# Patient Record
Sex: Female | Born: 1980 | Race: Black or African American | Hispanic: No | Marital: Married | Smoking: Never smoker
Health system: Southern US, Community
[De-identification: ages and names within clinical notes are randomized; demographics above are authoritative.]

## PROBLEM LIST (undated history)

## (undated) ENCOUNTER — Emergency Department (HOSPITAL_COMMUNITY): Payer: PRIVATE HEALTH INSURANCE

## (undated) DIAGNOSIS — Z789 Other specified health status: Secondary | ICD-10-CM

## (undated) DIAGNOSIS — D649 Anemia, unspecified: Secondary | ICD-10-CM

## (undated) HISTORY — DX: Other specified health status: Z78.9

## (undated) HISTORY — PX: NO PAST SURGERIES: SHX2092

## (undated) HISTORY — PX: OTHER SURGICAL HISTORY: SHX169

---

## 2007-05-29 ENCOUNTER — Inpatient Hospital Stay (HOSPITAL_COMMUNITY): Admission: AD | Admit: 2007-05-29 | Discharge: 2007-05-29 | Payer: Self-pay | Admitting: Obstetrics and Gynecology

## 2010-11-29 LAB — WET PREP, GENITAL
Trich, Wet Prep: NONE SEEN
Yeast Wet Prep HPF POC: NONE SEEN

## 2010-11-29 LAB — GC/CHLAMYDIA PROBE AMP, GENITAL: Chlamydia, DNA Probe: NEGATIVE

## 2015-08-15 DIAGNOSIS — Z3041 Encounter for surveillance of contraceptive pills: Secondary | ICD-10-CM | POA: Diagnosis not present

## 2015-08-15 DIAGNOSIS — Z113 Encounter for screening for infections with a predominantly sexual mode of transmission: Secondary | ICD-10-CM | POA: Diagnosis not present

## 2018-09-14 ENCOUNTER — Inpatient Hospital Stay (HOSPITAL_COMMUNITY)
Admission: AD | Admit: 2018-09-14 | Discharge: 2018-09-14 | Disposition: A | Discharge status: Home / Self Care | Payer: BC Managed Care – PPO | Attending: Obstetrics and Gynecology | Admitting: Obstetrics and Gynecology

## 2018-09-14 ENCOUNTER — Other Ambulatory Visit: Payer: Self-pay

## 2018-09-14 ENCOUNTER — Inpatient Hospital Stay (HOSPITAL_COMMUNITY): Payer: BC Managed Care – PPO

## 2018-09-14 ENCOUNTER — Encounter (HOSPITAL_COMMUNITY): Payer: Self-pay | Admitting: Student

## 2018-09-14 DIAGNOSIS — O3680X0 Pregnancy with inconclusive fetal viability, not applicable or unspecified: Secondary | ICD-10-CM

## 2018-09-14 DIAGNOSIS — Z3A01 Less than 8 weeks gestation of pregnancy: Secondary | ICD-10-CM | POA: Diagnosis not present

## 2018-09-14 DIAGNOSIS — O209 Hemorrhage in early pregnancy, unspecified: Secondary | ICD-10-CM | POA: Diagnosis present

## 2018-09-14 LAB — CBC
HCT: 35.8 % — ABNORMAL LOW (ref 36.0–46.0)
Hemoglobin: 12.2 g/dL (ref 12.0–15.0)
MCH: 31.6 pg (ref 26.0–34.0)
MCHC: 34.1 g/dL (ref 30.0–36.0)
MCV: 92.7 fL (ref 80.0–100.0)
Platelets: 284 10*3/uL (ref 150–400)
RBC: 3.86 MIL/uL — ABNORMAL LOW (ref 3.87–5.11)
RDW: 13.6 % (ref 11.5–15.5)
WBC: 8.5 10*3/uL (ref 4.0–10.5)
nRBC: 0 % (ref 0.0–0.2)

## 2018-09-14 LAB — URINALYSIS, ROUTINE W REFLEX MICROSCOPIC
Bacteria, UA: NONE SEEN
Bilirubin Urine: NEGATIVE
Glucose, UA: NEGATIVE mg/dL
Ketones, ur: 5 mg/dL — AB
Leukocytes,Ua: NEGATIVE
Nitrite: NEGATIVE
Protein, ur: NEGATIVE mg/dL
Specific Gravity, Urine: 1.021 (ref 1.005–1.030)
pH: 6 (ref 5.0–8.0)

## 2018-09-14 LAB — ABO/RH: ABO/RH(D): O POS

## 2018-09-14 LAB — HCG, QUANTITATIVE, PREGNANCY: hCG, Beta Chain, Quant, S: 480 m[IU]/mL — ABNORMAL HIGH (ref <5)

## 2018-09-14 NOTE — MAU Provider Note (Signed)
Chief Complaint: Possible Pregnancy and Vaginal Bleeding   First Provider Initiated Contact with Patient 09/14/18 1514     SUBJECTIVE HPI: Jeanette Kennedy is a 38 y.o. G2P0010 at Unknown who presents to Maternity Admissions reporting vaginal spotting. Symptoms started this morning. Noted brown spotting on toilet paper x 2 occurrences. Bleeding has not continued. Has had intermittent abdominal cramps this week. Had HCG done in the office ([place]) yesterday, reports it was 242. She is scheduled to return to the office on Monday for a repeat HCG. Patient is worried due to having a miscarriage last month. No recent intercourse or exams.  Currently denies pain.   History reviewed. No pertinent past medical history. OB History  Gravida Para Term Preterm AB Living  2       1    SAB TAB Ectopic Multiple Live Births  1            # Outcome Date GA Lbr Len/2nd Weight Sex Delivery Anes PTL Lv  2 Current           1 SAB 08/22/18           Past Surgical History:  Procedure Laterality Date  . NO PAST SURGERIES     Social History   Socioeconomic History  . Marital status: Single    Spouse name: Not on file  . Number of children: Not on file  . Years of education: Not on file  . Highest education level: Not on file  Occupational History  . Not on file  Social Needs  . Financial resource strain: Not on file  . Food insecurity    Worry: Not on file    Inability: Not on file  . Transportation needs    Medical: Not on file    Non-medical: Not on file  Tobacco Use  . Smoking status: Never Smoker  . Smokeless tobacco: Never Used  Substance and Sexual Activity  . Alcohol use: Not Currently    Comment: socailly  . Drug use: Never  . Sexual activity: Yes  Lifestyle  . Physical activity    Days per week: Not on file    Minutes per session: Not on file  . Stress: Not on file  Relationships  . Social Herbalist on phone: Not on file    Gets together: Not on file    Attends  religious service: Not on file    Active member of club or organization: Not on file    Attends meetings of clubs or organizations: Not on file    Relationship status: Not on file  . Intimate partner violence    Fear of current or ex partner: Not on file    Emotionally abused: Not on file    Physically abused: Not on file    Forced sexual activity: Not on file  Other Topics Concern  . Not on file  Social History Narrative  . Not on file   Family History  Problem Relation Age of Onset  . Cancer Mother   . Healthy Father    No current facility-administered medications on file prior to encounter.    Current Outpatient Medications on File Prior to Encounter  Medication Sig Dispense Refill  . prenatal vitamin w/FE, FA (PRENATAL 1 + 1) 27-1 MG TABS tablet Take 2 tablets by mouth daily at 12 noon. 2 gummies     No Known Allergies  I have reviewed patient's Past Medical Hx, Surgical Hx, Family Hx, Social Hx,  medications and allergies.   Review of Systems  Constitutional: Negative.   Gastrointestinal: Positive for abdominal pain and nausea. Negative for constipation, diarrhea and vomiting.  Genitourinary: Positive for vaginal bleeding. Negative for dysuria and vaginal discharge.    OBJECTIVE Patient Vitals for the past 24 hrs:  BP Temp Temp src Pulse Resp SpO2 Height Weight  09/14/18 1338 128/69 98.4 F (36.9 C) Oral 93 18 100 % 5\' 3"  (1.6 m) 60.6 kg   Constitutional: Well-developed, well-nourished female in no acute distress.  Cardiovascular: normal rate & rhythm, no murmur Respiratory: normal rate and effort. Lung sounds clear throughout GI: Abd soft, non-tender, Pos BS x 4. No guarding or rebound tenderness MS: Extremities nontender, no edema, normal ROM Neurologic: Alert and oriented x 4.  GU:     SPECULUM EXAM: NEFG, minimal amount of old brown blood. No active bleeding. Cervix pink/no friability    LAB RESULTS Results for orders placed or performed during the hospital  encounter of 09/14/18 (from the past 24 hour(s))  CBC     Status: Abnormal   Collection Time: 09/14/18  2:08 PM  Result Value Ref Range   WBC 8.5 4.0 - 10.5 K/uL   RBC 3.86 (L) 3.87 - 5.11 MIL/uL   Hemoglobin 12.2 12.0 - 15.0 g/dL   HCT 35.8 (L) 36.0 - 46.0 %   MCV 92.7 80.0 - 100.0 fL   MCH 31.6 26.0 - 34.0 pg   MCHC 34.1 30.0 - 36.0 g/dL   RDW 13.6 11.5 - 15.5 %   Platelets 284 150 - 400 K/uL   nRBC 0.0 0.0 - 0.2 %  ABO/Rh     Status: None   Collection Time: 09/14/18  2:08 PM  Result Value Ref Range   ABO/RH(D) O POS    No rh immune globuloin      NOT A RH IMMUNE GLOBULIN CANDIDATE, PT RH POSITIVE Performed at [facility] [address] [place], [place] [address]   hCG, quantitative, pregnancy     Status: Abnormal   Collection Time: 09/14/18  2:08 PM  Result Value Ref Range   hCG, Beta Chain, Quant, S 480 (H) <5 mIU/mL    IMAGING US Ob Less Than 14 Weeks With Ob Transvaginal  Result Date: 09/14/2018 CLINICAL DATA:  Vaginal bleeding, history of spontaneous abortion 08/22/2018 EXAM: OBSTETRIC <14 WK [place] AND TRANSVAGINAL OB US TECHNIQUE: Transvaginal ultrasound was performed for complete evaluation of the gestation as well as the maternal uterus, adnexal regions, and pelvic cul-de-sac. COMPARISON:  None. FINDINGS: Intrauterine gestational sac: None Yolk sac:  Not Visualized. Embryo:  Not Visualized. Cardiac Activity: Not Visualized. Heart Rate: Not applicable Subchorionic hemorrhage:  None visualized. Maternal uterus/adnexae: Uterus: Hypoechoic fundal fibroid measuring 2.4 x 2.2.4 cm. Endometrium: Nonvisualization of a gestational sac. Double-stripe endometrial thickness measures 12 mm. No vascular debris or mass within the endometrial canal. Small amount of fluid within the endometrial canal. Right ovary: Corpus luteum in the right ovary with "ring of fire vascularity". Left ovary: Unremarkable.  Normal color IMPRESSION: Nonvisualization of the gestational sac compatible with  history of spontaneous abortion reportedly 08/22/2018. Trace endometrial thickening and fluid in the endometrial canal without demonstrable vascularity. Findings are nonspecific although felt to most likely reflect endometrial blood products although hypovascular retained products of conception not fully excluded. Consider short-term follow-up pelvic ultrasound if symptoms persist or further evaluation with pelvic MRI with and without IV contrast as clinically warranted. These results will be called to the ordering clinician or  representative by the Radiologist Assistant, and communication documented in the PACS or zVision Dashboard. Electronically Signed   By: MD Lovena Le   On: 09/14/2018 16:44    MAU COURSE Orders Placed This Encounter  Procedures  . US OB LESS THAN 14 WEEKS WITH OB TRANSVAGINAL  . CBC  . hCG, quantitative, pregnancy  . Urinalysis, Routine w reflex microscopic  . ABO/Rh   No orders of the defined types were placed in this encounter.   MDM +UPT UA, wet prep, GC/chlamydia, CBC, ABO/Rh, quant hCG, and [place] today to rule out ectopic pregnancy  RH positive  HCG 480 Ultrasound shows no IUP Likely appropriate rise from yesterday's labs although I don't have access to those records. Still can't exclude possibility of ectopic pregnancy. Patient to keep her appointment on Monday for repeat HCG.   ASSESSMENT 1. Pregnancy of unknown anatomic location   2. Vaginal bleeding in pregnancy, first trimester     PLAN Discharge home in stable condition. SAB vs ectopic precautions  Allergies as of 09/14/2018   No Known Allergies     Medication List    TAKE these medications   prenatal vitamin w/FE, FA 27-1 MG Tabs tablet Take 2 tablets by mouth daily at 12 noon. 2 gummies        Jorje Guild, NP 09/14/2018  3:14 PM

## 2018-09-14 NOTE — MAU Note (Signed)
Had an SAB on 6/17(last hcg level- 21/9/0), ovulated 6/26.  Took a pregnancy test 7/6 it was positive (took several, the lines are progressing).  Took it because boobs have been really sore, and has nausea.  Today was about to go to lunch, went to the bathroom, saw brown on the tissue when she wiped.  HCG level yesterday was 242.

## 2018-09-14 NOTE — Discharge Instructions (Signed)
Vaginal Bleeding During Pregnancy, First Trimester  A small amount of bleeding from the vagina (spotting) is relatively common during early pregnancy. It usually stops on its own. Various things may cause bleeding or spotting during early pregnancy. Some bleeding may be related to the pregnancy, and some may not. In many cases, the bleeding is normal and is not a problem. However, bleeding can also be a sign of something serious. Be sure to tell your health care provider about any vaginal bleeding right away. Some possible causes of vaginal bleeding during the first trimester include:  Infection or inflammation of the cervix.  Growths (polyps) on the cervix.  Miscarriage or threatened miscarriage.  Pregnancy tissue developing outside of the uterus (ectopic pregnancy).  A mass of tissue developing in the uterus due to an egg being fertilized incorrectly (molar pregnancy). Follow these instructions at home: Activity  Follow instructions from your health care provider about limiting your activity. Ask what activities are safe for you.  If needed, make plans for someone to help with your regular activities.  Do not have sex or orgasms until your health care provider says that this is safe. General instructions  Take over-the-counter and prescription medicines only as told by your health care provider.  Pay attention to any changes in your symptoms.  Do not use tampons or douche.  Write down how many pads you use each day, how often you change pads, and how soaked (saturated) they are.  If you pass any tissue from your vagina, save the tissue so you can show it to your health care provider.  Keep all follow-up visits as told by your health care provider. This is important. Contact a health care provider if:  You have vaginal bleeding during any part of your pregnancy.  You have cramps or labor pains.  You have a fever. Get help right away if:  You have severe cramps in your  back or abdomen.  You pass large clots or a large amount of tissue from your vagina.  Your bleeding increases.  You feel light-headed or weak, or you faint.  You have chills.  You are leaking fluid or have a gush of fluid from your vagina. Summary  A small amount of bleeding (spotting) from the vagina is relatively common during early pregnancy.  Various things may cause bleeding or spotting in early pregnancy.  Be sure to tell your health care provider about any vaginal bleeding right away. This information is not intended to replace advice given to you by your health care provider. Make sure you discuss any questions you have with your health care provider. Document Released: 12/01/2004 Document Revised: 06/12/2018 Document Reviewed: 05/26/2016 Elsevier Patient Education  2020 [employer].

## 2018-09-17 ENCOUNTER — Other Ambulatory Visit (HOSPITAL_COMMUNITY): Payer: Self-pay | Admitting: Obstetrics & Gynecology

## 2018-09-20 ENCOUNTER — Encounter (HOSPITAL_COMMUNITY): Payer: Self-pay | Admitting: Obstetrics & Gynecology

## 2018-09-21 ENCOUNTER — Other Ambulatory Visit (HOSPITAL_COMMUNITY): Payer: Self-pay | Admitting: Obstetrics and Gynecology

## 2018-09-21 ENCOUNTER — Other Ambulatory Visit: Payer: Self-pay

## 2018-09-21 ENCOUNTER — Encounter (HOSPITAL_COMMUNITY): Payer: Self-pay

## 2018-09-21 ENCOUNTER — Ambulatory Visit (HOSPITAL_COMMUNITY)
Admission: RE | Admit: 2018-09-21 | Discharge: 2018-09-21 | Disposition: A | Discharge status: Home / Self Care | Payer: BC Managed Care – PPO | Source: Ambulatory Visit | Attending: Obstetrics and Gynecology | Admitting: Obstetrics and Gynecology

## 2018-09-21 ENCOUNTER — Ambulatory Visit (HOSPITAL_COMMUNITY): Payer: BC Managed Care – PPO | Admitting: *Deleted

## 2018-09-21 VITALS — BP 124/83 | HR 111 | Temp 98.6°F

## 2018-09-21 DIAGNOSIS — O008 Other ectopic pregnancy without intrauterine pregnancy: Secondary | ICD-10-CM

## 2018-09-21 DIAGNOSIS — O09521 Supervision of elderly multigravida, first trimester: Secondary | ICD-10-CM | POA: Diagnosis not present

## 2018-09-21 DIAGNOSIS — Z3A01 Less than 8 weeks gestation of pregnancy: Secondary | ICD-10-CM

## 2018-09-21 DIAGNOSIS — O0990 Supervision of high risk pregnancy, unspecified, unspecified trimester: Secondary | ICD-10-CM | POA: Diagnosis present

## 2018-09-21 DIAGNOSIS — Z3687 Encounter for antenatal screening for uncertain dates: Secondary | ICD-10-CM

## 2018-09-21 DIAGNOSIS — O3411 Maternal care for benign tumor of corpus uteri, first trimester: Secondary | ICD-10-CM | POA: Diagnosis not present

## 2018-09-24 ENCOUNTER — Other Ambulatory Visit (HOSPITAL_COMMUNITY): Payer: Self-pay | Admitting: *Deleted

## 2018-09-24 DIAGNOSIS — Z3491 Encounter for supervision of normal pregnancy, unspecified, first trimester: Secondary | ICD-10-CM

## 2018-10-02 ENCOUNTER — Inpatient Hospital Stay (HOSPITAL_COMMUNITY)
Admission: AD | Admit: 2018-10-02 | Discharge: 2018-10-02 | Disposition: A | Discharge status: Home / Self Care | Payer: BC Managed Care – PPO | Attending: Obstetrics and Gynecology | Admitting: Obstetrics and Gynecology

## 2018-10-02 ENCOUNTER — Inpatient Hospital Stay (HOSPITAL_COMMUNITY): Payer: BC Managed Care – PPO

## 2018-10-02 ENCOUNTER — Other Ambulatory Visit: Payer: Self-pay

## 2018-10-02 ENCOUNTER — Encounter (HOSPITAL_COMMUNITY): Payer: Self-pay | Admitting: *Deleted

## 2018-10-02 DIAGNOSIS — O418X10 Other specified disorders of amniotic fluid and membranes, first trimester, not applicable or unspecified: Secondary | ICD-10-CM

## 2018-10-02 DIAGNOSIS — O208 Other hemorrhage in early pregnancy: Secondary | ICD-10-CM | POA: Diagnosis not present

## 2018-10-02 DIAGNOSIS — O26891 Other specified pregnancy related conditions, first trimester: Secondary | ICD-10-CM | POA: Insufficient documentation

## 2018-10-02 DIAGNOSIS — N898 Other specified noninflammatory disorders of vagina: Secondary | ICD-10-CM | POA: Diagnosis present

## 2018-10-02 DIAGNOSIS — O468X1 Other antepartum hemorrhage, first trimester: Secondary | ICD-10-CM | POA: Diagnosis not present

## 2018-10-02 DIAGNOSIS — Z3A01 Less than 8 weeks gestation of pregnancy: Secondary | ICD-10-CM | POA: Insufficient documentation

## 2018-10-02 DIAGNOSIS — N939 Abnormal uterine and vaginal bleeding, unspecified: Secondary | ICD-10-CM

## 2018-10-02 LAB — WET PREP, GENITAL
Clue Cells Wet Prep HPF POC: NONE SEEN
Sperm: NONE SEEN
Trich, Wet Prep: NONE SEEN
Yeast Wet Prep HPF POC: NONE SEEN

## 2018-10-02 NOTE — MAU Provider Note (Signed)
History     CSN: [id number]  Arrival date and time: 10/02/18 1718   First Provider Initiated Contact with Patient 10/02/18 2128      Chief Complaint  Patient presents with  . Vaginal Discharge   Jeanette Kennedy is a 38 y.o. G2P0010 at [redacted]w[redacted]d who receives care at [facility].  She presents today for Vaginal Discharge that started on Saturday.  She denies other vaginal discharge prior to this and states she has had brown discharge only with wiping. She states she was evaluated and cleared, on Friday, for a suspected cornual pregnancy.  She states that they did identify an IUP, but did not see an embryo.  She states she had two [place] at that time and had had no recent intercourse.  She denies pain.      OB History    Gravida  2   Para      Term      Preterm      AB  1   Living        SAB  1   TAB      Ectopic      Multiple      Live Births              Past Medical History:  Diagnosis Date  . Medical history non-contributory     Past Surgical History:  Procedure Laterality Date  . NO PAST SURGERIES      Family History  Problem Relation Age of Onset  . Cancer Mother   . Healthy Father     Social History   Tobacco Use  . Smoking status: Never Smoker  . Smokeless tobacco: Never Used  Substance Use Topics  . Alcohol use: Not Currently    Comment: socailly  . Drug use: Never    Allergies: No Known Allergies  Medications Prior to Admission  Medication Sig Dispense Refill Last Dose  . prenatal vitamin w/FE, FA (PRENATAL 1 + 1) 27-1 MG TABS tablet Take 2 tablets by mouth daily at 12 noon. 2 gummies   10/02/2018 at Unknown time    Review of Systems  Constitutional: Negative for chills and fever.  Respiratory: Negative for cough and shortness of breath.   Gastrointestinal: Positive for nausea. Negative for abdominal pain, constipation, diarrhea and vomiting.  Genitourinary: Negative for difficulty urinating, dysuria, vaginal bleeding and  vaginal discharge.  Musculoskeletal: Negative for back pain.  Neurological: Negative for dizziness, light-headedness and headaches.   Physical Exam   Blood pressure 118/74, pulse 83, temperature 98.5 F (36.9 C), temperature source Oral, resp. rate 18, height 5\' 3"  (1.6 m), weight 61.5 kg, last menstrual period 08/09/2018.  Physical Exam  Constitutional: She appears well-developed and well-nourished. No distress.  HENT:  Head: Normocephalic and atraumatic.  Eyes: Conjunctivae are normal.  Neck: Normal range of motion.  Cardiovascular: Normal rate, regular rhythm and normal heart sounds.  Respiratory: Effort normal.  GI: Soft.  Genitourinary: Uterus is enlarged.    Vaginal bleeding present.  There is bleeding in the vagina.    Genitourinary Comments: Speculum Exam: -Vaginal Vault: Moderate amt dark red blood noted in vault.  Removed with faux swab x 1 for visualization of cervix -wet prep collected -Cervix:Pink, no lesions, cysts, or polyps.  Appears closed. No active bleeding from os-GC/CT collected -Bimanual Exam: Closed/~6-8 week uterine size   Skin: Skin is warm and dry.  Psychiatric: She has a normal mood and affect. Her behavior is normal.  MAU Course  Procedures Results for orders placed or performed during the hospital encounter of 10/02/18 (from the past 24 hour(s))  Wet prep, genital     Status: Abnormal   Collection Time: 10/02/18  9:46 PM   Specimen: Thin Prep Cervical/Endocervical  Result Value Ref Range   Yeast Wet Prep HPF POC NONE SEEN NONE SEEN   Trich, Wet Prep NONE SEEN NONE SEEN   Clue Cells Wet Prep HPF POC NONE SEEN NONE SEEN   WBC, Wet Prep HPF POC FEW (A) NONE SEEN   Sperm NONE SEEN    US Ob Transvaginal  Result Date: 10/02/2018 CLINICAL DATA:  Bleeding EXAM: TRANSVAGINAL OB ULTRASOUND TECHNIQUE: Transvaginal ultrasound was performed for complete evaluation of the gestation as well as the maternal uterus, adnexal regions, and pelvic cul-de-sac.  COMPARISON:  September 14, 2018 FINDINGS: Intrauterine gestational sac: Single Yolk sac:  Visualized. Embryo:  Visualized. Cardiac Activity: Visualized. Heart Rate: 115 bpm CRL:   5.7 mm   6 w 2 d                  [place] EDC: 05/26/2019 Subchorionic hemorrhage:  Small Maternal uterus/adnexae: Again noted are multiple intrauterine fibroids. The largest measuring 2.3 x 2.2 x 2.1 cm on the posterior right. Probable corpus luteum cyst seen in the right ovary. IMPRESSION: Single live intrauterine pregnancy measuring 6 weeks 2 days Small subchorionic hemorrhage Electronically Signed   By: Prudencio Pair M.D.   On: 10/02/2018 22:27    MDM  Pelvic Exam with cultures Labs: UA, Wet prep, and GC/CT TVUS Assessment and Plan  IUP at 7.5 weeks by LMP Vaginal Discharge  -Exam findings discussed.  -Cultures collected. -Will send for TVUS. -Will discuss results once complete.   Follow Up (10:47 PM) IUP at 6.2 weeks Subchorionic Hematoma  -Discussed US findings of SIUP at 6.2 weeks with FHR. -Informed that dates would be changed. -Patient expresses excitement and relief with findings. -Wet prep returns with insignificant findings. -Results discussed with patient. -Informed that GC/CT will return within 2-3 days. -Instructed to keep scheduled ob visit with V.Latham for tomorrow. -No questions or concerns. -Bleeding precautions given. -Encouraged to call or return to MAU if symptoms worsen or with the onset of new symptoms. -Discharged to home in stable condition.  Maryann Conners MSN, CNM 10/02/2018, 9:28 PM

## 2018-10-02 NOTE — Discharge Instructions (Signed)

## 2018-10-02 NOTE — MAU Note (Signed)
PT SAYS SHE HAS BROWN SPOTTING ONLY IN AM  WHEN SHE WIPES  X LESS THAN 1 HR.   ALL STARTED ON Thursday - SHE HAD U/S . THEN ON Friday HAD 2 U/S.  ON SAT - STARTED SPOTTING WHEN SHE WIPES - NO PAIN.- Sunday - NONE, MON- SOME , TUES- WED SOME .  NONE THIS WEEKEND . [place].   TODAY SHE SENT THEM A MESSAGE- TOLD  TO COME HERE - COULDN'T BE SEEN TODAY . LAST SEX-  June 28.

## 2018-10-04 LAB — GC/CHLAMYDIA PROBE AMP (~~LOC~~) NOT AT ARMC
Chlamydia: NEGATIVE
Neisseria Gonorrhea: NEGATIVE

## 2018-10-09 ENCOUNTER — Telehealth (HOSPITAL_COMMUNITY): Payer: Self-pay | Admitting: *Deleted

## 2018-10-12 ENCOUNTER — Inpatient Hospital Stay (HOSPITAL_COMMUNITY)
Admission: RE | Admit: 2018-10-12 | Discharge: 2018-10-12 | Disposition: A | Discharge status: Home / Self Care | Payer: BC Managed Care – PPO | Source: Ambulatory Visit

## 2018-10-12 ENCOUNTER — Encounter (HOSPITAL_COMMUNITY): Payer: Self-pay

## 2018-10-12 ENCOUNTER — Ambulatory Visit (HOSPITAL_COMMUNITY): Payer: BC Managed Care – PPO

## 2018-10-29 ENCOUNTER — Encounter (HOSPITAL_COMMUNITY): Payer: Self-pay

## 2018-11-21 ENCOUNTER — Other Ambulatory Visit: Payer: Self-pay

## 2019-01-07 ENCOUNTER — Encounter (HOSPITAL_COMMUNITY): Payer: Self-pay

## 2019-02-25 ENCOUNTER — Other Ambulatory Visit (HOSPITAL_COMMUNITY): Payer: Self-pay | Admitting: Obstetrics and Gynecology

## 2019-02-25 DIAGNOSIS — O402XX0 Polyhydramnios, second trimester, not applicable or unspecified: Secondary | ICD-10-CM

## 2019-03-08 NOTE — L&D Delivery Note (Signed)
Operative Delivery Note At 12:44 AM a viable female was delivered via Vaginal, Spontaneous.  Presentation: vertex; Position: Right,, Occiput,, Anterior; Station: +3.  Verbal consent: obtained from patient.  Risks and benefits discussed in detail.  Risks include, but are not limited to the risks of anesthesia, bleeding, infection, damage to maternal tissues, fetal cephalhematoma.  There is also the risk of inability to effect vaginal delivery of the head, or shoulder dystocia that cannot be resolved by established maneuvers, leading to the need for emergency cesarean section.  Bell vacuum applied- 2 pulls with 1 pop off.  Vacuum not reapplied.  Right mediolateral episiotomy performed.  Delivery completed- loose body cord noted.  For repair: rectal examination was completed- intact.  Complete 3rd degree noted.  The muscle was re-approximated using 3-0 vicryl in interrupted fashion.  The remaining repair was repaired in the usual fashion using 2-0 and 3-0 vicryl APGAR: 9, 9; weight pending  .   Placenta status: to L&D , .   Cord:  with the following complications: .  Cord pH: n/a  Anesthesia:  epidural Instruments: Bell Episiotomy: Median Lacerations: 3rd degree;Perineal Suture Repair: 2.0 3.0 vicryl Est. Blood Loss (mL):  454cc  Mom to postpartum.  Baby to Couplet care / Skin to Skin.  Jeanette Kennedy 05/17/2019, 1:18 AM

## 2019-03-21 ENCOUNTER — Other Ambulatory Visit: Payer: Self-pay

## 2019-03-21 ENCOUNTER — Encounter (HOSPITAL_COMMUNITY): Payer: Self-pay

## 2019-03-21 ENCOUNTER — Ambulatory Visit (HOSPITAL_COMMUNITY): Payer: PRIVATE HEALTH INSURANCE | Admitting: *Deleted

## 2019-03-21 ENCOUNTER — Ambulatory Visit (HOSPITAL_COMMUNITY)
Admission: RE | Admit: 2019-03-21 | Discharge: 2019-03-21 | Disposition: A | Discharge status: Home / Self Care | Payer: PRIVATE HEALTH INSURANCE | Source: Ambulatory Visit | Attending: Obstetrics and Gynecology | Admitting: Obstetrics and Gynecology

## 2019-03-21 VITALS — BP 117/76 | HR 75 | Temp 97.7°F

## 2019-03-21 DIAGNOSIS — O403XX0 Polyhydramnios, third trimester, not applicable or unspecified: Secondary | ICD-10-CM | POA: Insufficient documentation

## 2019-03-21 DIAGNOSIS — Z3A31 31 weeks gestation of pregnancy: Secondary | ICD-10-CM

## 2019-03-21 DIAGNOSIS — O09523 Supervision of elderly multigravida, third trimester: Secondary | ICD-10-CM

## 2019-03-21 DIAGNOSIS — O402XX0 Polyhydramnios, second trimester, not applicable or unspecified: Secondary | ICD-10-CM | POA: Diagnosis present

## 2019-03-21 DIAGNOSIS — O403XX1 Polyhydramnios, third trimester, fetus 1: Secondary | ICD-10-CM

## 2019-04-04 ENCOUNTER — Encounter (HOSPITAL_COMMUNITY): Payer: Self-pay | Admitting: Obstetrics and Gynecology

## 2019-04-04 ENCOUNTER — Inpatient Hospital Stay (HOSPITAL_COMMUNITY)
Admission: AD | Admit: 2019-04-04 | Discharge: 2019-04-04 | Disposition: A | Discharge status: Home / Self Care | Payer: PRIVATE HEALTH INSURANCE | Attending: Obstetrics and Gynecology | Admitting: Obstetrics and Gynecology

## 2019-04-04 ENCOUNTER — Other Ambulatory Visit: Payer: Self-pay

## 2019-04-04 DIAGNOSIS — Z3A32 32 weeks gestation of pregnancy: Secondary | ICD-10-CM | POA: Diagnosis not present

## 2019-04-04 DIAGNOSIS — O403XX0 Polyhydramnios, third trimester, not applicable or unspecified: Secondary | ICD-10-CM | POA: Diagnosis not present

## 2019-04-04 DIAGNOSIS — R109 Unspecified abdominal pain: Secondary | ICD-10-CM | POA: Diagnosis present

## 2019-04-04 DIAGNOSIS — D259 Leiomyoma of uterus, unspecified: Secondary | ICD-10-CM | POA: Diagnosis not present

## 2019-04-04 DIAGNOSIS — O09523 Supervision of elderly multigravida, third trimester: Secondary | ICD-10-CM | POA: Insufficient documentation

## 2019-04-04 DIAGNOSIS — O3413 Maternal care for benign tumor of corpus uteri, third trimester: Secondary | ICD-10-CM | POA: Diagnosis not present

## 2019-04-04 LAB — URINALYSIS, ROUTINE W REFLEX MICROSCOPIC
Bilirubin Urine: NEGATIVE
Glucose, UA: NEGATIVE mg/dL
Hgb urine dipstick: NEGATIVE
Ketones, ur: NEGATIVE mg/dL
Leukocytes,Ua: NEGATIVE
Nitrite: NEGATIVE
Protein, ur: NEGATIVE mg/dL
Specific Gravity, Urine: 1.008 (ref 1.005–1.030)
pH: 7 (ref 5.0–8.0)

## 2019-04-04 MED ORDER — LACTATED RINGERS IV SOLN: INTRAVENOUS | Status: DC

## 2019-04-04 MED ORDER — NIFEDIPINE 10 MG PO CAPS: 10.0000 mg | ORAL_CAPSULE | ORAL | Status: DC | PRN

## 2019-04-04 MED ORDER — BETAMETHASONE SOD PHOS & ACET 6 (3-3) MG/ML IJ SUSP
12.0000 mg | INTRAMUSCULAR | Status: DC
  Administered 2019-04-04: 12 mg via INTRAMUSCULAR
  Filled 2019-04-04 (×2): qty 5

## 2019-04-04 MED ORDER — NIFEDIPINE 10 MG PO CAPS
10.0000 mg | ORAL_CAPSULE | ORAL | Status: DC | PRN
  Administered 2019-04-04 (×3): 10 mg via ORAL
  Filled 2019-04-04 (×4): qty 1

## 2019-04-04 MED ORDER — LACTATED RINGERS IV BOLUS
500.0000 mL | Freq: Once | INTRAVENOUS | Status: AC
  Administered 2019-04-04: 500 mL via INTRAVENOUS

## 2019-04-04 MED ORDER — NIFEDIPINE 10 MG PO CAPS: 10.0000 mg | ORAL_CAPSULE | Freq: Four times a day (QID) | ORAL | 0 refills | Status: DC | PRN

## 2019-04-04 NOTE — MAU Note (Deleted)
Sent over for 2nd dose of methotrexate.  Pt reports increase pain in lower abd, started last night and has increased throughout the day.  Started bleeding today.

## 2019-04-04 NOTE — MAU Note (Signed)
Started as period cramps yesterday, went in for rtn visit today, was placed on monitor, ctx's noted.

## 2019-04-04 NOTE — Progress Notes (Signed)
S:  Reports decrease in "cramping" sensation  O:  FHR 140/ moderate variability/ accels present/ decels absent TOCO: ocassional, soft abd  A/P 39 y.o. G3P0010 [redacted]w[redacted]d R/O PTL     -FFN neg Rx for Procardia 10 mg PO Q6 hrs PRN ctx Return to MAU 04/05/19 @ 1900 for BMZ #2 Follow-up in office in 5 days Preterm labor precautions  Burman Foster, MSN, CNM 04/04/2019 11:59 PM

## 2019-04-04 NOTE — MAU Note (Addendum)
Jeanette Kennedy, 39 y.o., G3P0010, [redacted]w[redacted]d presents to MAU from office. Pt was seen for routine OB appt and c/o "pedriod cramps". NST reveals ctx Q 2-3 min. SVE-closed/50%/-1/anterior position. fFN collected in office. Hx of fibroids, AMA, and polyhydramnios (mild). Here for extended EFM, IV fluids, antenatal steroids, and Procardia PRN.  Last eval on 03/21/19 at [facility] BPP 8/8, vtx, posterior placenta, AFI 21.58, with MVP > 9 cm, EFW 4# 3oz (74%ile), cervix 3.86, fundal fibroid 2.3 x 3.5 x 1.2 (subserosal, posterior 3.3 x 2.3 x 2.4 (IM)      O: BP 120/70   Pulse 78   Temp 98.3 F (36.8 C) (Oral)   Resp 18   Wt 64.5 kg   SpO2 100%   Breastfeeding Unknown   BMI 25.21 kg/m  Labs Urinalysis    Component Value Date/Time   COLORURINE YELLOW 04/04/2019 1900   APPEARANCEUR HAZY (A) 04/04/2019 1900   LABSPEC 1.008 04/04/2019 1900   PHURINE 7.0 04/04/2019 1900   GLUCOSEU NEGATIVE 04/04/2019 1900   HGBUR NEGATIVE 04/04/2019 1900   BILIRUBINUR NEGATIVE 04/04/2019 [place] NEGATIVE 04/04/2019 1900   PROTEINUR NEGATIVE 04/04/2019 1900   NITRITE NEGATIVE 04/04/2019 1900   LEUKOCYTESUR NEGATIVE 04/04/2019 1900    fFN-negative   Past medical history:  Past Medical History:  Diagnosis Date  . Medical history non-contributory     Past surgical history:  Past Surgical History:  Procedure Laterality Date  . NO PAST SURGERIES     Family History:  Family History  Problem Relation Age of Onset  . Cancer Mother   . Healthy Father     Social History:  reports that she has never smoked. She has never used smokeless tobacco. She reports previous alcohol use. She reports that she does not use drugs.  Physical exam: Gen: AAO x3 Cardiac: RRR Resp: clear in all fields GI: gravid, soft GU: negative for vaginal bleeding, negative for LOF Ext: no edema, negative for pain, tenderness, cords  A: Jeanette Kennedy 39 y.o. G3P0010 [redacted]w[redacted]d R/O Preterm labor     -fFN negative (collected in  office, resulted @ 2030) Cat 1 fetal surveillance AMA Borderline polyhydramnios (AFI 21.58 on 1/14) Uterine fibroids  P: Continuous EFM IV fluids Betamethasone 12mg  IM now, rpt in 24 hrs Start Procardia for PTL after IV fluid bolus   Plan reviewed with Dr. Burna Cash MSN, CNM 04/04/2019 8:32 PM

## 2019-04-05 ENCOUNTER — Other Ambulatory Visit: Payer: Self-pay

## 2019-04-05 ENCOUNTER — Inpatient Hospital Stay (HOSPITAL_COMMUNITY)
Admission: AD | Admit: 2019-04-05 | Discharge: 2019-04-05 | Disposition: A | Discharge status: Home / Self Care | Payer: PRIVATE HEALTH INSURANCE | Source: Ambulatory Visit | Attending: Obstetrics & Gynecology | Admitting: Obstetrics & Gynecology

## 2019-04-05 ENCOUNTER — Encounter (HOSPITAL_COMMUNITY): Payer: Self-pay | Admitting: Obstetrics & Gynecology

## 2019-04-05 DIAGNOSIS — O479 False labor, unspecified: Secondary | ICD-10-CM

## 2019-04-05 DIAGNOSIS — O4703 False labor before 37 completed weeks of gestation, third trimester: Secondary | ICD-10-CM

## 2019-04-05 DIAGNOSIS — Z3A32 32 weeks gestation of pregnancy: Secondary | ICD-10-CM | POA: Diagnosis not present

## 2019-04-05 DIAGNOSIS — O4700 False labor before 37 completed weeks of gestation, unspecified trimester: Secondary | ICD-10-CM

## 2019-04-05 DIAGNOSIS — Z3689 Encounter for other specified antenatal screening: Secondary | ICD-10-CM

## 2019-04-05 MED ORDER — BETAMETHASONE SOD PHOS & ACET 6 (3-3) MG/ML IJ SUSP
12.0000 mg | Freq: Once | INTRAMUSCULAR | Status: AC
  Administered 2019-04-05: 19:00:00 12 mg via INTRAMUSCULAR

## 2019-04-05 NOTE — MAU Provider Note (Signed)
History     CSN: [id number]  Arrival date and time: 04/05/19 1758   First Provider Initiated Contact with Patient 04/05/19 1930      Chief Complaint  Patient presents with  . Contractions   Ms. TEMPRANCE BENTANCOURT is a 39 y.o. G3P0010 at [redacted]w[redacted]d who presents to MAU for PTL evaluation after ctx began at 12noon. Pt was seen in MAU yesterday after a routine doctor's appointment showed frequent contractions on the NST in the office. Pt was then sent to MAU for evaluation and given Procardia x3, which she reports worked well for her. Pt was sent home with an RX for Procardia, but reports her phmarmacy was out of stock of the medication and the contractions started up again, so when she came to MAU for her second betamethasone injection today, she was put on the monitor by the RN. Pt reports she felt them when she was first in MAU, but reports they are currently 1/10 and have gone down since she came to MAU.  Pt denies ctx, change in vaginal discharge amount/color/consistency, VB, new onset backache, intermittent abdominal discomfort/pain, pelvic pressure/pain, cramping. Pt denies chest pain and SOB.  Pt denies constipation, diarrhea, or urinary problems. Pt denies fever, chills, fatigue, sweating or changes in appetite. Pt denies dizziness, light-headedness, weakness.  Pt denies VB, LOF and reports good FM.  Current pregnancy problems? PTCtx due to thinning cervix yesterday Blood Type? O Positive Allergies? NKDA Current medications? Procardia, iron, PNV Current PNC & next appt? [facility], 04/09/2019   OB History    Gravida  3   Para      Term      Preterm      AB  1   Living        SAB  1   TAB      Ectopic      Multiple      Live Births              Past Medical History:  Diagnosis Date  . Medical history non-contributory     Past Surgical History:  Procedure Laterality Date  . NO PAST SURGERIES      Family History  Problem Relation Age of Onset  . Cancer  Mother   . Healthy Father     Social History   Tobacco Use  . Smoking status: Never Smoker  . Smokeless tobacco: Never Used  Substance Use Topics  . Alcohol use: Not Currently    Comment: socailly  . Drug use: Never    Allergies: No Known Allergies  No medications prior to admission.    Review of Systems  Constitutional: Negative for chills, diaphoresis, fatigue and fever.  Eyes: Negative for visual disturbance.  Respiratory: Negative for shortness of breath.   Cardiovascular: Negative for chest pain.  Gastrointestinal: Negative for abdominal pain, constipation, diarrhea, nausea and vomiting.  Genitourinary: Negative for dysuria, flank pain, frequency, pelvic pain, urgency, vaginal bleeding and vaginal discharge.  Neurological: Negative for dizziness, weakness, light-headedness and headaches.   Physical Exam   Blood pressure 128/78, pulse 91, temperature 98.2 F (36.8 C), resp. rate 16, SpO2 100 %, unknown if currently breastfeeding.  Patient Vitals for the past 24 hrs:  BP Temp Pulse Resp SpO2  04/05/19 1848 128/78 98.2 F (36.8 C) 91 16 100 %   Physical Exam  Constitutional: She is oriented to person, place, and time. She appears well-developed and well-nourished. No distress.  HENT:  Head: Normocephalic and atraumatic.  Respiratory: Effort  normal.  GI: Soft.  Genitourinary: There is no rash, tenderness or lesion on the right labia. There is no rash, tenderness or lesion on the left labia.    Genitourinary Comments: CE unchanged since yesterday. Dilation: Closed Effacement (%): Thick Cervical Position: Anterior Exam by:: N.Jaylanie Boschee,NP   Neurological: She is alert and oriented to person, place, and time.  Skin: Skin is warm and dry. She is not diaphoretic.  Psychiatric: She has a normal mood and affect. Her behavior is normal. Judgment and thought content normal.   No results found for this or any previous visit (from the past 24 hour(s)).  MAU Course   Procedures  MDM -preterm ctx on admission, almost resolved at time of talking with provider -pt's husband able to obtain RX for Procardia from another pharmacy -2nd BMZ given today -cervix unchanged from yesterday's visit -Dilation: Closed Effacement (%): Thick Cervical Position: Anterior Exam by:: N.Tamekia Rotter,NP EFM: reactive       -baseline: 140       -variability: moderate       -accels: present, 15x15       -decels: few variables       -TOCO: irritability/frequent small ctx early on, none towards end           of tracing -pt discharged to home in stable condition  Orders Placed This Encounter  Procedures  . Discharge patient    Order Specific Question:   Discharge disposition    Answer:   01-Home or Self Care [1]    Order Specific Question:   Discharge patient date    Answer:   04/05/2019   Meds ordered this encounter  Medications  . betamethasone acetate-betamethasone sodium phosphate (CELESTONE) injection 12 mg   Assessment and Plan   1. Preterm contractions   2. [redacted] weeks gestation of pregnancy   3. NST (non-stress test) reactive    Allergies as of 04/05/2019   No Known Allergies     Medication List    TAKE these medications   IRON PO Take by mouth.   NIFEdipine 10 MG capsule Commonly known as: PROCARDIA Take 1 capsule (10 mg total) by mouth every 6 (six) hours as needed (Contractions).   prenatal vitamin w/FE, FA 27-1 MG Tabs tablet Take 2 tablets by mouth daily at 12 noon. 2 gummies      -strict PTL/return MAU precautions given -pt discharged to home in stable condition  Gerrie Nordmann Keigan Girten 04/06/2019, 6:34 PM

## 2019-04-05 NOTE — MAU Note (Signed)
.   Jeanette Kennedy is a 39 y.o. at [redacted]w[redacted]d here in MAU reporting: she was here for her 2nd dose of BMZ but she has had some contractions on and off all day. [facility] was out of the procardia for her to pick up yesterday so she has not been able to start taking it. Denies any VB or LOF  Onset of complaint: ongoing Pain score: 4 Vitals:   04/05/19 1848  BP: 128/78  Pulse: 91  Resp: 16  Temp: 98.2 F (36.8 C)  SpO2: 100%     FHT:145 Lab orders placed from triage:

## 2019-04-05 NOTE — Discharge Instructions (Signed)
Abdominal Pain During Pregnancy  Abdominal pain is common during pregnancy, and has many possible causes. Some causes are more serious than others, and sometimes the cause is not known. Abdominal pain can be a sign that labor is starting. It can also be caused by normal growth and stretching of muscles and ligaments during pregnancy. Always tell your health care provider if you have any abdominal pain. Follow these instructions at home:  Do not have sex or put anything in your vagina until your pain goes away completely.  Get plenty of rest until your pain improves.  Drink enough fluid to keep your urine pale yellow.  Take over-the-counter and prescription medicines only as told by your health care provider.  Keep all follow-up visits as told by your health care provider. This is important. Contact a health care provider if:  Your pain continues or gets worse after resting.  You have lower abdominal pain that: ? Comes and goes at regular intervals. ? Spreads to your back. ? Is similar to menstrual cramps.  You have pain or burning when you urinate. Get help right away if:  You have a fever or chills.  You have vaginal bleeding.  You are leaking fluid from your vagina.  You are passing tissue from your vagina.  You have vomiting or diarrhea that lasts for more than 24 hours.  Your baby is moving less than usual.  You feel very weak or faint.  You have shortness of breath.  You develop severe pain in your upper abdomen. Summary  Abdominal pain is common during pregnancy, and has many possible causes.  If you experience abdominal pain during pregnancy, tell your health care provider right away.  Follow your health care provider's home care instructions and keep all follow-up visits as directed. This information is not intended to replace advice given to you by your health care provider. Make sure you discuss any questions you have with your health care  provider. Document Revised: 06/11/2018 Document Reviewed: 05/26/2016 Elsevier Patient Education  2020 Elsevier Inc.        Preterm Labor and Birth Information  The normal length of a pregnancy is 39-41 weeks. Preterm labor is when labor starts before 37 completed weeks of pregnancy. What are the risk factors for preterm labor? Preterm labor is more likely to occur in women who:  Have certain infections during pregnancy such as a bladder infection, sexually transmitted infection, or infection inside the uterus (chorioamnionitis).  Have a shorter-than-normal cervix.  Have gone into preterm labor before.  Have had surgery on their cervix.  Are younger than age 17 or older than age 35.  Are African American.  Are pregnant with twins or multiple babies (multiple gestation).  Take street drugs or smoke while pregnant.  Do not gain enough weight while pregnant.  Became pregnant shortly after having been pregnant. What are the symptoms of preterm labor? Symptoms of preterm labor include:  Cramps similar to those that can happen during a menstrual period. The cramps may happen with diarrhea.  Pain in the abdomen or lower back.  Regular uterine contractions that may feel like tightening of the abdomen.  A feeling of increased pressure in the pelvis.  Increased watery or bloody mucus discharge from the vagina.  Water breaking (ruptured amniotic sac). Why is it important to recognize signs of preterm labor? It is important to recognize signs of preterm labor because babies who are born prematurely may not be fully developed. This can put them at   an increased risk for:  Long-term (chronic) heart and lung problems.  Difficulty immediately after birth with regulating body systems, including blood sugar, body temperature, heart rate, and breathing rate.  Bleeding in the brain.  Cerebral palsy.  Learning difficulties.  Death. These risks are highest for babies who are  born before 34 weeks of pregnancy. How is preterm labor treated? Treatment depends on the length of your pregnancy, your condition, and the health of your baby. It may involve:  Having a stitch (suture) placed in your cervix to prevent your cervix from opening too early (cerclage).  Taking or being given medicines, such as: ? Hormone medicines. These may be given early in pregnancy to help support the pregnancy. ? Medicine to stop contractions. ? Medicines to help mature the baby's lungs. These may be prescribed if the risk of delivery is high. ? Medicines to prevent your baby from developing cerebral palsy. If the labor happens before 34 weeks of pregnancy, you may need to stay in the hospital. What should I do if I think I am in preterm labor? If you think that you are going into preterm labor, call your health care provider right away. How can I prevent preterm labor in future pregnancies? To increase your chance of having a full-term pregnancy:  Do not use any tobacco products, such as cigarettes, chewing tobacco, and e-cigarettes. If you need help quitting, ask your health care provider.  Do not use street drugs or medicines that have not been prescribed to you during your pregnancy.  Talk with your health care provider before taking any herbal supplements, even if you have been taking them regularly.  Make sure you gain a healthy amount of weight during your pregnancy.  Watch for infection. If you think that you might have an infection, get it checked right away.  Make sure to tell your health care provider if you have gone into preterm labor before. This information is not intended to replace advice given to you by your health care provider. Make sure you discuss any questions you have with your health care provider. Document Revised: 06/15/2018 Document Reviewed: 07/15/2015 Elsevier Patient Education  2020 Elsevier Inc.  

## 2019-04-20 ENCOUNTER — Other Ambulatory Visit: Payer: Self-pay

## 2019-04-20 ENCOUNTER — Encounter (HOSPITAL_COMMUNITY): Payer: Self-pay | Admitting: Obstetrics and Gynecology

## 2019-04-20 ENCOUNTER — Inpatient Hospital Stay (HOSPITAL_COMMUNITY)
Admission: AD | Admit: 2019-04-20 | Discharge: 2019-04-20 | Disposition: A | Discharge status: Home / Self Care | Payer: PRIVATE HEALTH INSURANCE | Source: Ambulatory Visit | Attending: Obstetrics and Gynecology | Admitting: Obstetrics and Gynecology

## 2019-04-20 ENCOUNTER — Ambulatory Visit: Payer: PRIVATE HEALTH INSURANCE

## 2019-04-20 DIAGNOSIS — Z3A34 34 weeks gestation of pregnancy: Secondary | ICD-10-CM

## 2019-04-20 DIAGNOSIS — O09523 Supervision of elderly multigravida, third trimester: Secondary | ICD-10-CM | POA: Insufficient documentation

## 2019-04-20 DIAGNOSIS — O4703 False labor before 37 completed weeks of gestation, third trimester: Secondary | ICD-10-CM | POA: Diagnosis present

## 2019-04-20 DIAGNOSIS — Z3689 Encounter for other specified antenatal screening: Secondary | ICD-10-CM

## 2019-04-20 LAB — WET PREP, GENITAL
Clue Cells Wet Prep HPF POC: NONE SEEN
Sperm: NONE SEEN
Trich, Wet Prep: NONE SEEN
Yeast Wet Prep HPF POC: NONE SEEN

## 2019-04-20 NOTE — MAU Note (Signed)
Patient presents to MAU c/o intense ctx and loss of mucous plug. Patient reports ctx q1-3 m.  Patient reports called on call provider and was told to take her procardia @ 1837 and drink a liter of water within 30 mins and rest.  Patient states ctx are still intense.  Patient reports being 1cm dilated at her appt on Thursday.  +FM, denies LOF or VB.

## 2019-04-20 NOTE — MAU Provider Note (Signed)
History     CSN: [id number]  Arrival date and time: 04/20/19 2032   First Provider Initiated Contact with Patient 04/20/19 2126      Chief Complaint  Patient presents with  . Contractions  . Vaginal Discharge   Jeanette Kennedy is a 39 y.o. G2P0010 at [redacted]w[redacted]d who receives care at [facility].  She presents today for Contractions and Vaginal Discharge.  She states she went to the office on Thursday and was put on the monitor after reporting mild contractions.  She was told she was having contractions every minute and was told to go home and take her Procardia because they were not intense and experienced relief.  She reports she has continued to take the Procardia every 6 hours and started to notice that they were increasing in intensity Friday night, but not unbearable because she could sleep.  She states today she couldn't rest and called the on call provder.  She was told to take her procardia, which she took at 637pm, and drink a liter of water in 30 minutes.  She states she completed this and experienced no relief and therefore came in.  She states denies LOF and denies VB, but reports she lost her mucous plug around 7pm.  She endorses good fetal movement.  She denies sexual intercourse in the last week.   Of note patient is visibly uncomfortable upon provider arrival and appears to be breathing through contractions.  However, none are graphing on tocometry.      OB History    Gravida  2   Para      Term      Preterm      AB  1   Living        SAB  1   TAB      Ectopic      Multiple      Live Births              Past Medical History:  Diagnosis Date  . Medical history non-contributory     Past Surgical History:  Procedure Laterality Date  . NO PAST SURGERIES      Family History  Problem Relation Age of Onset  . Cancer Mother   . Healthy Father     Social History   Tobacco Use  . Smoking status: Never Smoker  . Smokeless tobacco: Never Used  Substance  Use Topics  . Alcohol use: Not Currently    Comment: socailly  . Drug use: Never    Allergies: No Known Allergies  Medications Prior to Admission  Medication Sig Dispense Refill Last Dose  . Ferrous Sulfate (IRON PO) Take by mouth.   04/20/2019 at 1708  . NIFEdipine (PROCARDIA) 10 MG capsule Take 1 capsule (10 mg total) by mouth every 6 (six) hours as needed (Contractions). 30 capsule 0 04/20/2019 at 1837  . prenatal vitamin w/FE, FA (PRENATAL 1 + 1) 27-1 MG TABS tablet Take 2 tablets by mouth daily at 12 noon. 2 gummies   04/20/2019 at 0730    Review of Systems  Constitutional: Negative for chills and fever.  Respiratory: Negative for cough and shortness of breath.   Gastrointestinal: Positive for abdominal pain (Tightening) and nausea (Manageable). Negative for constipation, diarrhea and vomiting.  Genitourinary: Positive for vaginal discharge. Negative for difficulty urinating, dysuria, pelvic pain and vaginal bleeding.  Musculoskeletal: Positive for back pain (Lower back hurts with contractions-started today).  Neurological: Negative for dizziness, light-headedness and headaches.   Physical Exam  Blood pressure 115/76, pulse 97, temperature 98.2 F (36.8 C), temperature source Oral, resp. rate 20, height 5\' 3"  (1.6 m), weight 64.4 kg, SpO2 100 %, unknown if currently breastfeeding.  Physical Exam  Constitutional: She is oriented to person, place, and time. She appears well-developed and well-nourished. She appears distressed (Mildly; with contractions).  HENT:  Head: Normocephalic and atraumatic.  Eyes: Conjunctivae are normal.  Cardiovascular: Normal rate.  Respiratory: Effort normal.  GI: Soft. There is no abdominal tenderness.  Genitourinary: Cervix exhibits no motion tenderness and no friability.    Vaginal discharge present.     No vaginal bleeding.  No bleeding in the vagina.    Genitourinary Comments: Speculum Exam: -Normal External Genitalia: Non tender, no apparent  discharge at introitus.  -Vaginal Vault: Pink mucosa with good rugae. Moderate amt thick white discharge -wet prep collected -Cervix:Pink, no lesions, cysts, or polyps.  Appears closed. No active bleeding from os -Bimanual Exam: Dilation: Closed Effacement (%): 50 Exam by:: Gavin Pound, CNM    Musculoskeletal:     Cervical back: Normal range of motion.  Neurological: She is alert and oriented to person, place, and time.  Skin: Skin is warm and dry.  Psychiatric: She has a normal mood and affect. Her behavior is normal.    Fetal Assessment 150 bpm, Mod Var, -Decels, +Accels Toco: None graphed, but visualized q4-56min  MAU Course   Results for orders placed or performed during the hospital encounter of 04/20/19 (from the past 24 hour(s))  Wet prep, genital     Status: Abnormal   Collection Time: 04/20/19  9:47 PM   Specimen: Cervix  Result Value Ref Range   Yeast Wet Prep HPF POC NONE SEEN NONE SEEN   Trich, Wet Prep NONE SEEN NONE SEEN   Clue Cells Wet Prep HPF POC NONE SEEN NONE SEEN   WBC, Wet Prep HPF POC FEW (A) NONE SEEN   Sperm NONE SEEN    No results found.  MDM PE Labs: Wet Prep EFM  Assessment and Plan  40 year old G2P0010  SIUP at 34.6 weeks Cat I FT Contractions S/P BMZ  -POC reviewed -Exam performed and findings discussed. -Patient expresses relief with no change in VE -Wet prep collected and sent. -Patient reports contractions have decreased in frequency and intensity since provider arrival. -Will give oral hydration and monitor while awaiting results. -NST reactive  Maryann Conners MSN, CNM 04/20/2019, 9:26 PM   Reassessment (10:40 PM)  -Wet prep returns negative. -Patient states she has had "a few" contractions, but feels well overall. -Instructed to keep her next scheduled appt. -Instructed to continue procardia as needed every 6 hours. -Patient verbalizes understanding and has no questions or concerns. -Encouraged to call primary ob or  return to MAU if symptoms worsen or with the onset of new symptoms. -Discharged to home in improved condition.  Maryann Conners MSN, CNM Advanced Practice Provider, [facility]

## 2019-04-20 NOTE — Discharge Instructions (Signed)

## 2019-05-16 ENCOUNTER — Inpatient Hospital Stay (HOSPITAL_COMMUNITY)
Admission: AD | Admit: 2019-05-16 | Discharge: 2019-05-18 | DRG: 768 | Disposition: A | Discharge status: Home / Self Care | Payer: No Typology Code available for payment source | Attending: Obstetrics and Gynecology | Admitting: Obstetrics and Gynecology

## 2019-05-16 ENCOUNTER — Encounter (HOSPITAL_COMMUNITY): Payer: Self-pay | Admitting: Obstetrics and Gynecology

## 2019-05-16 ENCOUNTER — Inpatient Hospital Stay (HOSPITAL_COMMUNITY): Payer: No Typology Code available for payment source | Admitting: Anesthesiology

## 2019-05-16 ENCOUNTER — Other Ambulatory Visit: Payer: Self-pay

## 2019-05-16 DIAGNOSIS — Z3A38 38 weeks gestation of pregnancy: Secondary | ICD-10-CM

## 2019-05-16 DIAGNOSIS — Z20822 Contact with and (suspected) exposure to covid-19: Secondary | ICD-10-CM | POA: Diagnosis present

## 2019-05-16 DIAGNOSIS — O26893 Other specified pregnancy related conditions, third trimester: Secondary | ICD-10-CM | POA: Diagnosis present

## 2019-05-16 HISTORY — DX: Anemia, unspecified: D64.9

## 2019-05-16 LAB — CBC
HCT: 34.2 % — ABNORMAL LOW (ref 36.0–46.0)
Hemoglobin: 11.5 g/dL — ABNORMAL LOW (ref 12.0–15.0)
MCH: 31.3 pg (ref 26.0–34.0)
MCHC: 33.6 g/dL (ref 30.0–36.0)
MCV: 93.2 fL (ref 80.0–100.0)
Platelets: 152 10*3/uL (ref 150–400)
RBC: 3.67 MIL/uL — ABNORMAL LOW (ref 3.87–5.11)
RDW: 15.5 % (ref 11.5–15.5)
WBC: 8.3 10*3/uL (ref 4.0–10.5)
nRBC: 0 % (ref 0.0–0.2)

## 2019-05-16 LAB — TYPE AND SCREEN
ABO/RH(D): O POS
Antibody Screen: NEGATIVE

## 2019-05-16 LAB — RESPIRATORY PANEL BY RT PCR (FLU A&B, COVID)
Influenza A by PCR: NEGATIVE
Influenza B by PCR: NEGATIVE
SARS Coronavirus 2 by RT PCR: NEGATIVE

## 2019-05-16 MED ORDER — SOD CITRATE-CITRIC ACID 500-334 MG/5ML PO SOLN: 30.0000 mL | ORAL | Status: DC | PRN

## 2019-05-16 MED ORDER — FENTANYL-BUPIVACAINE-NACL 0.5-0.125-0.9 MG/250ML-% EP SOLN: 12.0000 mL/h | EPIDURAL | Status: DC | PRN

## 2019-05-16 MED ORDER — PHENYLEPHRINE 40 MCG/ML (10ML) SYRINGE FOR IV PUSH (FOR BLOOD PRESSURE SUPPORT): 80.0000 ug | PREFILLED_SYRINGE | INTRAVENOUS | Status: DC | PRN

## 2019-05-16 MED ORDER — OXYCODONE-ACETAMINOPHEN 5-325 MG PO TABS: 1.0000 | ORAL_TABLET | ORAL | Status: DC | PRN

## 2019-05-16 MED ORDER — OXYTOCIN BOLUS FROM INFUSION
500.0000 mL | Freq: Once | INTRAVENOUS | Status: AC
  Administered 2019-05-17: 01:00:00 500 mL via INTRAVENOUS

## 2019-05-16 MED ORDER — FLEET ENEMA 7-19 GM/118ML RE ENEM: 1.0000 | ENEMA | Freq: Every day | RECTAL | Status: DC | PRN

## 2019-05-16 MED ORDER — ACETAMINOPHEN 500 MG PO TABS
1000.0000 mg | ORAL_TABLET | Freq: Once | ORAL | Status: AC
  Administered 2019-05-16: 17:00:00 1000 mg via ORAL
  Filled 2019-05-16: qty 2

## 2019-05-16 MED ORDER — LACTATED RINGERS IV SOLN: 500.0000 mL | INTRAVENOUS | Status: DC | PRN

## 2019-05-16 MED ORDER — ACETAMINOPHEN 325 MG PO TABS: 650.0000 mg | ORAL_TABLET | ORAL | Status: DC | PRN

## 2019-05-16 MED ORDER — EPHEDRINE 5 MG/ML INJ: 10.0000 mg | INTRAVENOUS | Status: DC | PRN

## 2019-05-16 MED ORDER — ONDANSETRON HCL 4 MG/2ML IJ SOLN: 4.0000 mg | Freq: Four times a day (QID) | INTRAMUSCULAR | Status: DC | PRN

## 2019-05-16 MED ORDER — DIPHENHYDRAMINE HCL 50 MG/ML IJ SOLN: 12.5000 mg | INTRAMUSCULAR | Status: DC | PRN

## 2019-05-16 MED ORDER — FENTANYL-BUPIVACAINE-NACL 0.5-0.125-0.9 MG/250ML-% EP SOLN
EPIDURAL | Status: AC
  Filled 2019-05-16: qty 250

## 2019-05-16 MED ORDER — LACTATED RINGERS IV SOLN: INTRAVENOUS | Status: DC

## 2019-05-16 MED ORDER — OXYTOCIN 10 UNIT/ML IJ SOLN
INTRAMUSCULAR | Status: AC
  Filled 2019-05-16: qty 1

## 2019-05-16 MED ORDER — OXYCODONE-ACETAMINOPHEN 5-325 MG PO TABS: 2.0000 | ORAL_TABLET | ORAL | Status: DC | PRN

## 2019-05-16 MED ORDER — LIDOCAINE HCL (PF) 1 % IJ SOLN
30.0000 mL | INTRAMUSCULAR | Status: AC | PRN
  Administered 2019-05-16: 9 mL via SUBCUTANEOUS
  Filled 2019-05-16: qty 30

## 2019-05-16 MED ORDER — OXYTOCIN 40 UNITS IN NORMAL SALINE INFUSION - SIMPLE MED
2.5000 [IU]/h | INTRAVENOUS | Status: DC
  Filled 2019-05-16: qty 1000

## 2019-05-16 MED ORDER — LACTATED RINGERS IV SOLN
500.0000 mL | Freq: Once | INTRAVENOUS | Status: AC
  Administered 2019-05-16: 18:00:00 500 mL via INTRAVENOUS

## 2019-05-16 NOTE — Progress Notes (Signed)
Subjective: Comfortable with epidural  Objective: BP 123/89   Pulse 67   Temp 98.2 F (36.8 C) (Oral)   Resp 17   Ht 5\' 3"  (1.6 m)   Wt 65.8 kg   SpO2 100%   BMI 25.69 kg/m  No intake/output data recorded. No intake/output data recorded.  FHT: Category 1 UC:   regular, every 2-3 minutes SVE:   Dilation: 10 Effacement (%): 90 Station: Plus 1 Exam by:: Irene Shipper, CNM  Assessment:  G2P0010 at 38.4 IUP in active labor Cat 1 strip  Plan: C and P Anticipate SVD  Jeanette Kennedy CNM, MSN 05/16/2019, 10:06 PM

## 2019-05-16 NOTE — MAU Note (Signed)
.   Jeanette Kennedy is a 39 y.o. at [redacted]w[redacted]d here in MAU reporting: ctx that began at 2300 last night. No VB or lOF. Endorses good fetal movement.   Pain score: 10 Vitals:   05/16/19 1508  BP: (!) 142/84  Pulse: 71  Resp: 17  Temp: 98.1 F (36.7 C)  SpO2: 100%     FHT:138 Lab orders placed from triage:

## 2019-05-16 NOTE — H&P (Signed)
Jeanette Kennedy is a 39 y.o. female presenting for labor.  I was called by mid-level in MAU about pt in active labor and progressing quickly.  She was 3cm and an hour later per report, she was 7cm.  Upon arrival to the floor, I was called by Lenna Sciara, RN stating the patient is 9cm with BBOW.  Upon my arrival pt had BBOW and AROM on exam.  Small amount of bleeding.  Pt was using nitrous for pain management.  Pt was AL after AROM and not progressing as quickly and awaiting Anesthesiologist to come place epidural.   OB History    Gravida  2   Para      Term      Preterm      AB  1   Living        SAB  1   TAB      Ectopic      Multiple      Live Births             Past Medical History:  Diagnosis Date  . Anemia   . Medical history non-contributory    Past Surgical History:  Procedure Laterality Date  . NO PAST SURGERIES     Family History: family history includes Cancer in her mother; Healthy in her father. Social History:  reports that she has never smoked. She has never used smokeless tobacco. She reports previous alcohol use. She reports that she does not use drugs.     Maternal Diabetes: No Genetic Screening: Normal Maternal Ultrasounds/Referrals: Normal Fetal Ultrasounds or other Referrals:  None Maternal Substance Abuse:  No Significant Maternal Medications:  None Significant Maternal Lab Results:  Group B Strep negative Other Comments:  None  Review of Systems  Denies F/C/N/V/D History Dilation: Lip/rim Effacement (%): 100 Station: 0 Exam by:: Dr Mancel Bale Blood pressure (!) 121/56, pulse 75, temperature 98.1 F (36.7 C), temperature source Oral, resp. rate 20, height 5\' 3"  (1.6 m), weight 65.8 kg, SpO2 100 %, unknown if currently breastfeeding. Exam Physical Exam  Lungs CTA CV RRR Abd gravid, NT Ext no calf tenderness Prenatal labs: ABO, Rh: O+ Antibody: Ab neg Rubella:  Immune RPR:   NR HBsAg:   Neg HIV:  NR  GBS:    negative  Assessment/Plan: P0 at 38 4/7wks being admitted d/t active labor with clear fluid upon AROM.  LGA earlier in the pregnancy with mild polyhydramnios resolved and EFW 68% on last u/s in early Feb.  Pt getting epidural now.  Tracing has been cat 1.  Anticipate SVD. EFW by leopold 7 1/2lbs.   Delice Lesch 05/16/2019, 6:27 PM

## 2019-05-16 NOTE — Anesthesia Preprocedure Evaluation (Signed)
Anesthesia Evaluation  Patient identified by MRN, date of birth, ID band Patient awake    Reviewed: Allergy & Precautions, H&P , NPO status , Patient's Chart, lab work & pertinent test results  Airway Mallampati: I  TM Distance: >3 FB Neck ROM: full    Dental no notable dental hx. (+) Teeth Intact   Pulmonary neg pulmonary ROS,    Pulmonary exam normal breath sounds clear to auscultation       Cardiovascular negative cardio ROS   Rhythm:regular Rate:Normal     Neuro/Psych negative neurological ROS  negative psych ROS   GI/Hepatic negative GI ROS, Neg liver ROS,   Endo/Other  negative endocrine ROS  Renal/GU negative Renal ROS     Musculoskeletal   Abdominal Normal abdominal exam  (+)   Peds  Hematology  (+) Blood dyscrasia, anemia ,   Anesthesia Other Findings   Reproductive/Obstetrics (+) Pregnancy                             Anesthesia Physical Anesthesia Plan  ASA: II  Anesthesia Plan: Epidural   Post-op Pain Management:    Induction:   PONV Risk Score and Plan:   Airway Management Planned:   Additional Equipment:   Intra-op Plan:   Post-operative Plan:   Informed Consent: I have reviewed the patients History and Physical, chart, labs and discussed the procedure including the risks, benefits and alternatives for the proposed anesthesia with the patient or authorized representative who has indicated his/her understanding and acceptance.       Plan Discussed with:   Anesthesia Plan Comments:         Anesthesia Quick Evaluation

## 2019-05-16 NOTE — Anesthesia Procedure Notes (Signed)
Epidural Patient location during procedure: OB Start time: 05/16/2019 6:43 PM End time: 05/16/2019 6:45 PM  Staffing Anesthesiologist: Lyn Hollingshead, MD Performed: anesthesiologist   Preanesthetic Checklist Completed: patient identified, IV checked, site marked, risks and benefits discussed, surgical consent, monitors and equipment checked, pre-op evaluation and timeout performed  Epidural Patient position: sitting Prep: DuraPrep and site prepped and draped Patient monitoring: continuous pulse ox and blood pressure Approach: midline Location: L3-L4 Injection technique: LOR air  Needle:  Needle type: Tuohy  Needle gauge: 17 G Needle length: 9 cm and 9 Needle insertion depth: 5 cm cm Catheter type: closed end flexible Catheter size: 19 Gauge Catheter at skin depth: 10 cm Test dose: negative and Other  Assessment Events: blood not aspirated, injection not painful, no injection resistance, no paresthesia and negative IV test  Additional Notes Reason for block:procedure for pain

## 2019-05-17 ENCOUNTER — Encounter (HOSPITAL_COMMUNITY): Payer: Self-pay | Admitting: Obstetrics and Gynecology

## 2019-05-17 LAB — CBC
HCT: 25.4 % — ABNORMAL LOW (ref 36.0–46.0)
Hemoglobin: 8.6 g/dL — ABNORMAL LOW (ref 12.0–15.0)
MCH: 31.6 pg (ref 26.0–34.0)
MCHC: 33.9 g/dL (ref 30.0–36.0)
MCV: 93.4 fL (ref 80.0–100.0)
Platelets: 135 10*3/uL — ABNORMAL LOW (ref 150–400)
RBC: 2.72 MIL/uL — ABNORMAL LOW (ref 3.87–5.11)
RDW: 15.7 % — ABNORMAL HIGH (ref 11.5–15.5)
WBC: 10.9 10*3/uL — ABNORMAL HIGH (ref 4.0–10.5)
nRBC: 0 % (ref 0.0–0.2)

## 2019-05-17 LAB — RPR: RPR Ser Ql: NONREACTIVE

## 2019-05-17 MED ORDER — CEFAZOLIN SODIUM-DEXTROSE 2-4 GM/100ML-% IV SOLN
2.0000 g | Freq: Once | INTRAVENOUS | Status: AC
  Administered 2019-05-17: 01:00:00 2 g via INTRAVENOUS
  Filled 2019-05-17: qty 100

## 2019-05-17 MED ORDER — WITCH HAZEL-GLYCERIN EX PADS
1.0000 "application " | MEDICATED_PAD | CUTANEOUS | Status: DC | PRN
  Administered 2019-05-17: 1 via TOPICAL

## 2019-05-17 MED ORDER — ACETAMINOPHEN 325 MG PO TABS: 650.0000 mg | ORAL_TABLET | ORAL | Status: DC | PRN

## 2019-05-17 MED ORDER — TERBUTALINE SULFATE 1 MG/ML IJ SOLN: 0.2500 mg | Freq: Once | INTRAMUSCULAR | Status: DC | PRN

## 2019-05-17 MED ORDER — COCONUT OIL OIL: 1.0000 "application " | TOPICAL_OIL | Status: DC | PRN

## 2019-05-17 MED ORDER — SENNOSIDES-DOCUSATE SODIUM 8.6-50 MG PO TABS
2.0000 | ORAL_TABLET | ORAL | Status: DC
  Administered 2019-05-17: 23:00:00 2 via ORAL
  Filled 2019-05-17: qty 2

## 2019-05-17 MED ORDER — ONDANSETRON HCL 4 MG PO TABS: 4.0000 mg | ORAL_TABLET | ORAL | Status: DC | PRN

## 2019-05-17 MED ORDER — BENZOCAINE-MENTHOL 20-0.5 % EX AERO
1.0000 "application " | INHALATION_SPRAY | CUTANEOUS | Status: DC | PRN
  Administered 2019-05-17: 1 via TOPICAL
  Filled 2019-05-17: qty 56

## 2019-05-17 MED ORDER — OXYTOCIN 40 UNITS IN NORMAL SALINE INFUSION - SIMPLE MED
1.0000 m[IU]/min | INTRAVENOUS | Status: DC
  Administered 2019-05-16: 2 m[IU]/min via INTRAVENOUS

## 2019-05-17 MED ORDER — TETANUS-DIPHTH-ACELL PERTUSSIS 5-2.5-18.5 LF-MCG/0.5 IM SUSP: 0.5000 mL | Freq: Once | INTRAMUSCULAR | Status: DC

## 2019-05-17 MED ORDER — SIMETHICONE 80 MG PO CHEW: 80.0000 mg | CHEWABLE_TABLET | ORAL | Status: DC | PRN

## 2019-05-17 MED ORDER — PRENATAL MULTIVITAMIN CH
1.0000 | ORAL_TABLET | Freq: Every day | ORAL | Status: DC
  Administered 2019-05-17: 12:00:00 1 via ORAL
  Filled 2019-05-17: qty 1

## 2019-05-17 MED ORDER — DIPHENHYDRAMINE HCL 25 MG PO CAPS: 25.0000 mg | ORAL_CAPSULE | Freq: Four times a day (QID) | ORAL | Status: DC | PRN

## 2019-05-17 MED ORDER — ONDANSETRON HCL 4 MG/2ML IJ SOLN: 4.0000 mg | INTRAMUSCULAR | Status: DC | PRN

## 2019-05-17 MED ORDER — IBUPROFEN 600 MG PO TABS
600.0000 mg | ORAL_TABLET | Freq: Four times a day (QID) | ORAL | Status: DC
  Administered 2019-05-17 – 2019-05-18 (×5): 600 mg via ORAL
  Filled 2019-05-17 (×5): qty 1

## 2019-05-17 MED ORDER — DIBUCAINE (PERIANAL) 1 % EX OINT: 1.0000 "application " | TOPICAL_OINTMENT | CUTANEOUS | Status: DC | PRN

## 2019-05-17 MED ORDER — ZOLPIDEM TARTRATE 5 MG PO TABS: 5.0000 mg | ORAL_TABLET | Freq: Every evening | ORAL | Status: DC | PRN

## 2019-05-17 NOTE — Anesthesia Postprocedure Evaluation (Signed)
Anesthesia Post Note  Patient: Jeanette Kennedy  Procedure(s) Performed: AN AD HOC LABOR EPIDURAL     Patient location during evaluation: Mother Baby Anesthesia Type: Epidural Level of consciousness: awake and alert Pain management: pain level controlled Vital Signs Assessment: post-procedure vital signs reviewed and stable Respiratory status: spontaneous breathing, nonlabored ventilation and respiratory function stable Cardiovascular status: stable Postop Assessment: no headache, no backache and epidural receding Anesthetic complications: no    Last Vitals:  Vitals:   05/17/19 0755 05/17/19 1200  BP: 117/88 136/85  Pulse: 93 72  Resp: 18 18  Temp: 36.8 C 36.9 C  SpO2:      Last Pain:  Vitals:   05/17/19 1200  TempSrc: Oral  PainSc: 0-No pain   Pain Goal:                   Dmoni Fortson

## 2019-05-17 NOTE — Lactation Note (Signed)
This note was copied from a baby's chart. Lactation Consultation Note  Patient Name: Jeanette Kennedy [id number] Date: 05/17/2019 Reason for consult: Initial assessment;Primapara;1st time breastfeeding;Early term 37-38.6wks;Other (Comment)(DAT+)  1406 - 1424 - I conducted an initial consult with Jeanette Kennedy, a P1 with 60 hour old son, Jeanette Kennedy. Jeanette Kennedy was latched in cradle hold on the left breast upon entry. Jeanette Kennedy reports that she is elated, as baby has not latched well to date. Jeanette Kennedy briefly latched in recovery and latched one time earlier for just a few minutes. I noted rhythmic suckling sequences. She states that he has been actively feeding now for about 15 minutes. The feeding lasted 20 minutes and then he released the breast.  Baby began to cue again, and I showed Jeanette Kennedy how to switch to cross cradle hold to better position her nipple near baby's mouth. She was able to repeat back. He licked a little and went to sleep STS.   I conducted basic breast feeding eduction at the bedside. I showed her some key points form the [employer] guide including milk storage guidelines. I also shared our breast feeding brochure and reviewed it. Jeanette Kennedy has two personal pumps at home which were donated by friends. One was barely used.   I also shared this information with her support person. We discussed baby's DAT + diagnosis and possibility of setting up a pump this evening. I discussed this with the RN assigned to the room, and she states that baby's bilirubin has been trending normal at this time. I encouraged Jeanette Kennedy to call lactation for support this evening as needed. She verbalized understanding.  At this time the feeding plan is to breast feed 8-12 times a day on demand and wake baby to feed as needed.   Maternal Data Formula Feeding for Exclusion: No Does the patient have breastfeeding experience prior to this delivery?: No  Feeding Feeding Type: Breast Fed  LATCH Score Latch: Grasps breast  easily, tongue down, lips flanged, rhythmical sucking.  Audible Swallowing: A few with stimulation  Type of Nipple: Everted at rest and after stimulation  Comfort (Breast/Nipple): Soft / non-tender  Hold (Positioning): No assistance needed to correctly position infant at breast.  LATCH Score: 9  Interventions Interventions: Breast feeding basics reviewed;Skin to skin;Assisted with latch;Breast compression;DEBP   Consult Status Consult Status: Follow-up Date: 05/18/19 Follow-up type: In-patient    Lenore Manner 05/17/2019, 2:42 PM

## 2019-05-18 MED ORDER — IBUPROFEN 600 MG PO TABS: 600.0000 mg | ORAL_TABLET | Freq: Four times a day (QID) | ORAL | 0 refills | Status: DC

## 2019-05-18 NOTE — Discharge Summary (Signed)
OB Discharge Summary  Patient Name: Jeanette Kennedy DOB: [birth year] MRN: [id number]  Date of admission: 05/16/2019 Delivering MD: Janyth Pupa  Date of delivery: 05/17/2019 Type of delivery: VAVD  Newborn Data: Sex: Baby Female Name: Orlie Pollen Circumcision: Yes, desires outpatient Live born female  Birth Weight: 7 lb 10.4 oz (3470 g) APGAR: 55, 9  Newborn Delivery   Birth date/time: 05/17/2019 00:44:00 Delivery type: Vaginal, Spontaneous    Feeding: breast Infant being discharge to home with mother in stable condition.   Admitting diagnosis: Normal labor and delivery [O80] Intrauterine pregnancy: [redacted]w[redacted]d     Secondary diagnosis:  Active Problems:   Normal labor and delivery   SVD (spontaneous vaginal delivery)                               Complications: None                                                              Intrapartum Procedures: vacuum Postpartum Procedures: none Complications-Operative and Postpartum: none Augmentation: AROM  History of Present Illness: Jeanette Kennedy is a 38 y.o. female, G2P1011, who presents at [redacted]w[redacted]d weeks gestation. The patient has been followed at [facility]   Patient Active Problem List   Diagnosis Date Noted  . SVD (spontaneous vaginal delivery) 05/17/2019  . Normal labor and delivery 05/16/2019   Hospital course:  Onset of Labor With Vaginal Delivery     39 y.o. yo G2P1011 at [redacted]w[redacted]d was admitted in Active Labor on 05/16/2019. Patient had an uncomplicated labor course as follows:  Membrane Rupture Time/Date: 5:55 PM ,05/16/2019   Intrapartum Procedures: Episiotomy: Median [2];Right Mediolateral [4]                                         Lacerations:  3rd degree [4];Perineal [11]  Patient had a delivery of a Viable infant. 05/17/2019  Information for the patient's newborn:  Amatullah, [place] [id number]  Delivery Method: [place] had an uncomplicated postpartum course.  She is ambulating,  tolerating a regular diet, passing flatus, and urinating well. Patient is discharged home in stable condition on 05/18/19.  Postpartum Day # 1 : S/P VAVD due to active labor. Patient up ad lib, denies syncope or dizziness. Reports consuming regular diet without issues and denies N/V.  Denies issues with urination and reports bleeding is "ok."  Patient is breastfeeding and reports going well. Undecided for postpartum contraception. Pain is being appropriately managed with use of Motrin.   Physical exam  Vitals:   05/17/19 1200 05/17/19 1552 05/17/19 2303 05/18/19 0506  BP: 136/85 121/85 118/69 114/71  Pulse: 72 95 78 72  Resp: 18 17    Temp: 98.5 F (36.9 C) 98.8 F (37.1 C) 98 F (36.7 C) 97.9 F (36.6 C)  TempSrc: Oral Oral Oral Oral  SpO2:   99% 100%  Weight:      Height:       General: alert, cooperative and no distress Lochia: appropriate Uterine Fundus: firm Perineum: approximated and healing well DVT Evaluation: No evidence of DVT seen on physical exam.  Labs: Lab Results  Component Value Date   WBC 10.9 (H) 05/17/2019   HGB 8.6 (L) 05/17/2019   HCT 25.4 (L) 05/17/2019   MCV 93.4 05/17/2019   PLT 135 (L) 05/17/2019   No flowsheet data found.  Date of discharge: 05/18/2019 Discharge Diagnoses: Term Pregnancy-delivered Discharge instruction: per After Visit Summary and "Baby and Me Booklet".  Activity:           unrestricted Advance as tolerated. Pelvic rest for 6 weeks.  Diet:                routine Medications: Ibuprofen Postpartum contraception: Undecided Condition:  Pt discharge to home with baby in stable condition  Meds: Allergies as of 05/18/2019   No Known Allergies     Medication List    TAKE these medications   Fusion Plus Caps Take 1 capsule by mouth daily.   ibuprofen 600 MG tablet Commonly known as: ADVIL Take 1 tablet (600 mg total) by mouth every 6 (six) hours.   prenatal multivitamin Tabs tablet Take 1 tablet by mouth daily at 12 noon.        Discharge Follow Up:  Follow-up [facility]. Schedule an appointment as soon as possible for a visit in 6 week(s).   Specialty: Obstetrics and Gynecology Contact information: [address] [place] [phone]          05/18/2019, 10:47 AM  Suzan Nailer, CNM

## 2019-05-31 ENCOUNTER — Ambulatory Visit: Payer: PRIVATE HEALTH INSURANCE

## 2019-11-16 IMAGING — US TRANSVAGINAL OB ULTRASOUND
1 series · 15 of 28 positions shown · non-contrast
Comparison: September 14, 2018

CLINICAL DATA: Bleeding

EXAM:
TRANSVAGINAL OB ULTRASOUND
TECHNIQUE: Transvaginal ultrasound was performed for complete evaluation of the
gestation as well as the maternal uterus, adnexal regions, and
pelvic cul-de-sac.

[Series 1: transvaginal ob ultrasound · 15 of 42 slices shown]
[im 1/42]
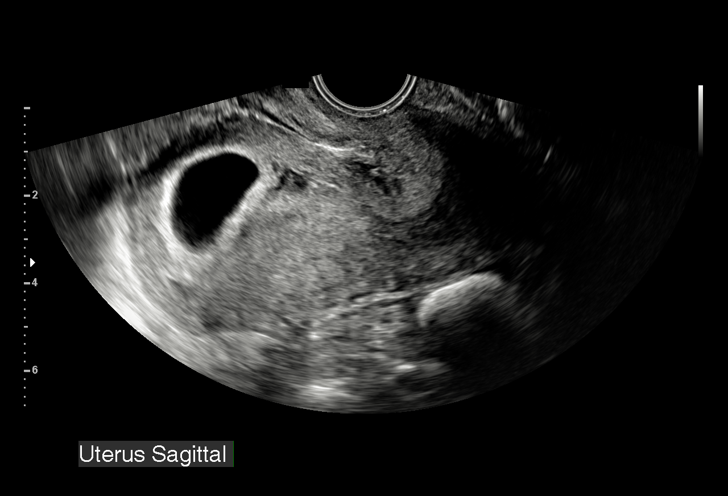
[im 4/42]
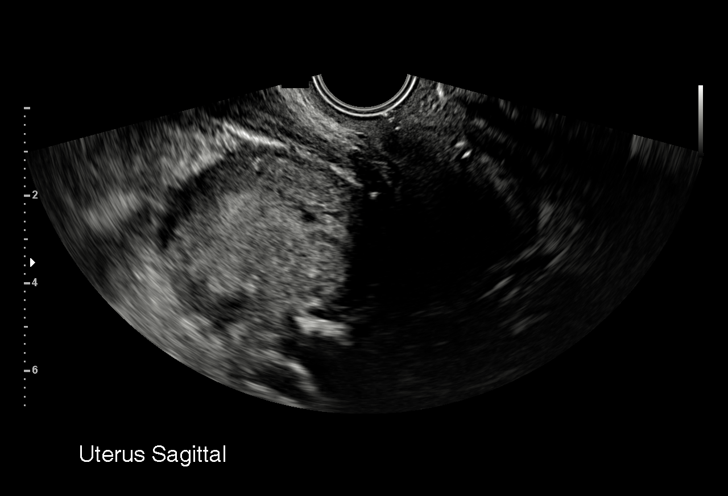
[im 7/42]
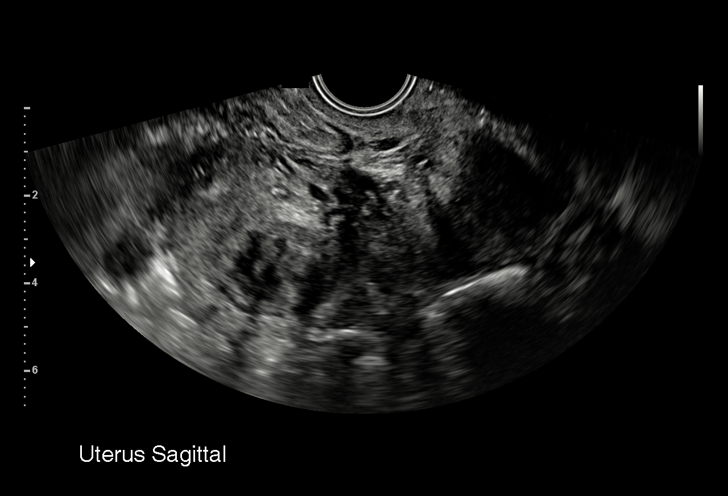
[im 10/42]
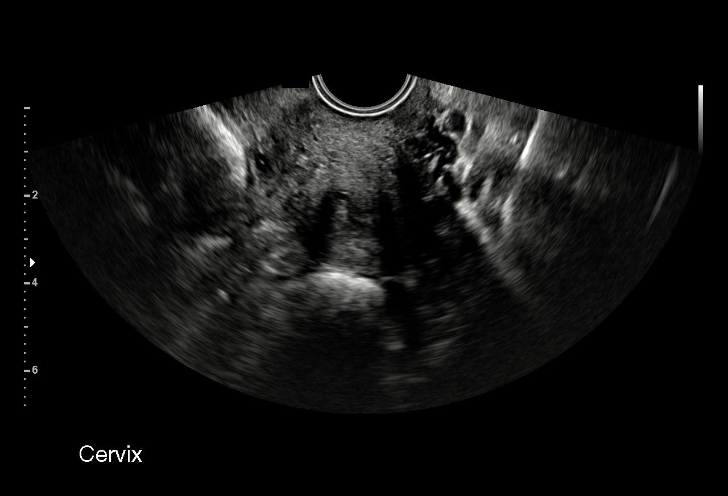
[im 13/42]
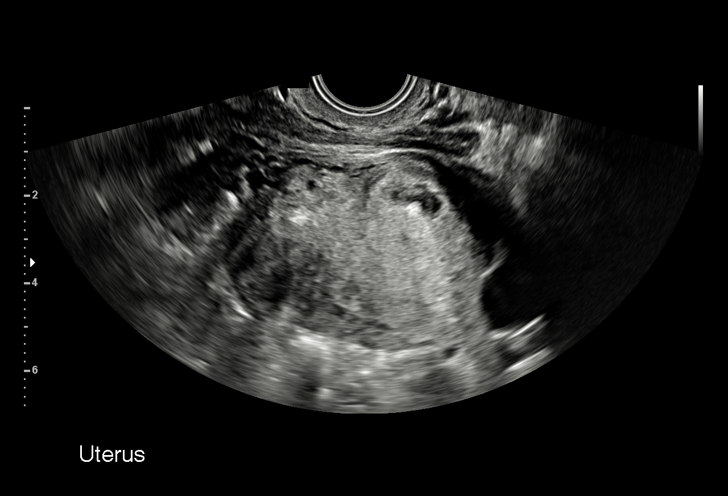
[im 16/42]
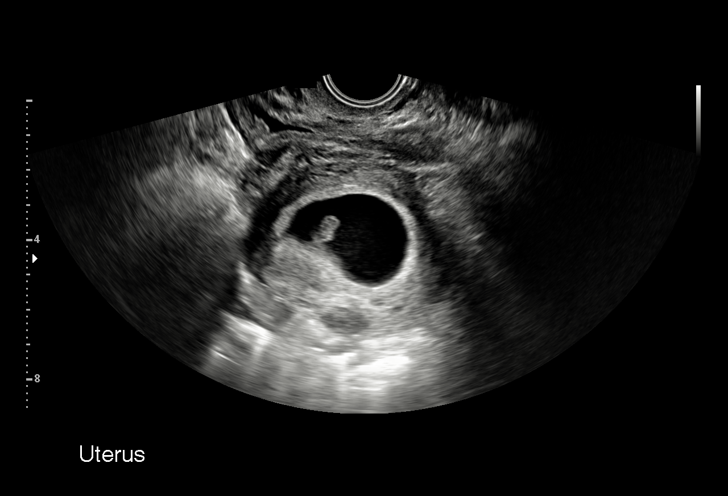
[im 19/42]
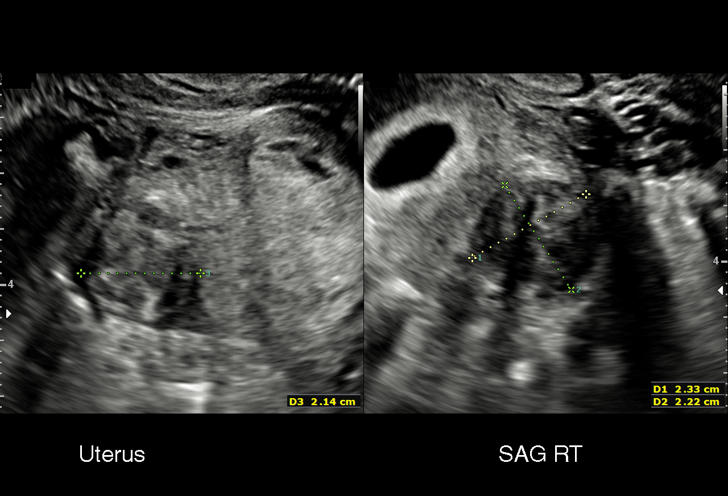
[im 22/42]
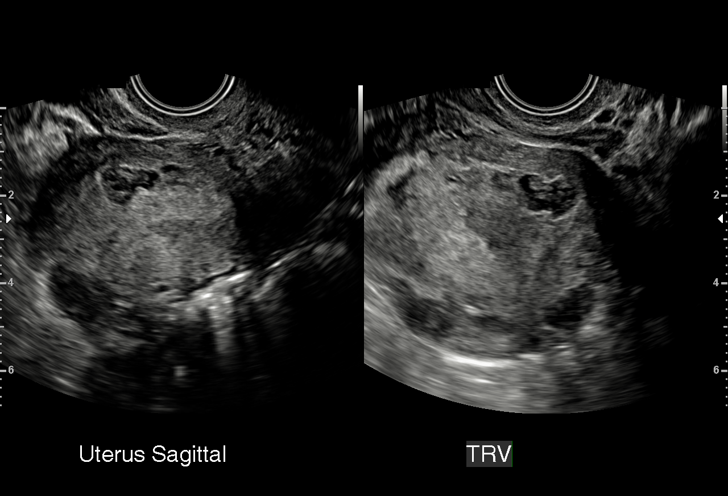
[im 23/42]
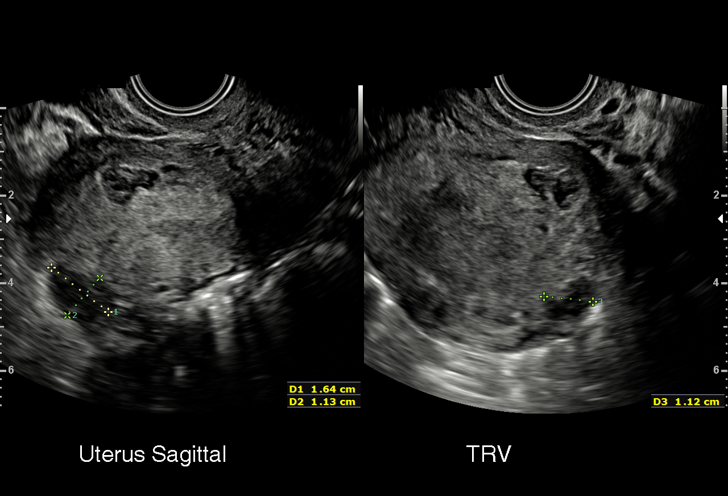
[im 26/42]
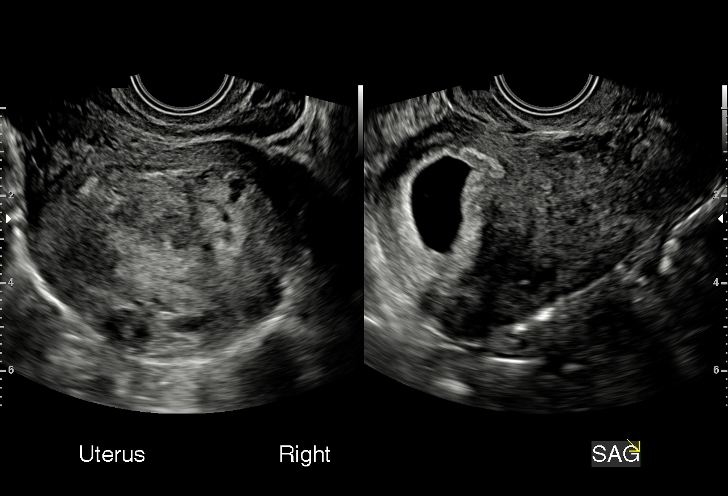
[im 29/42]
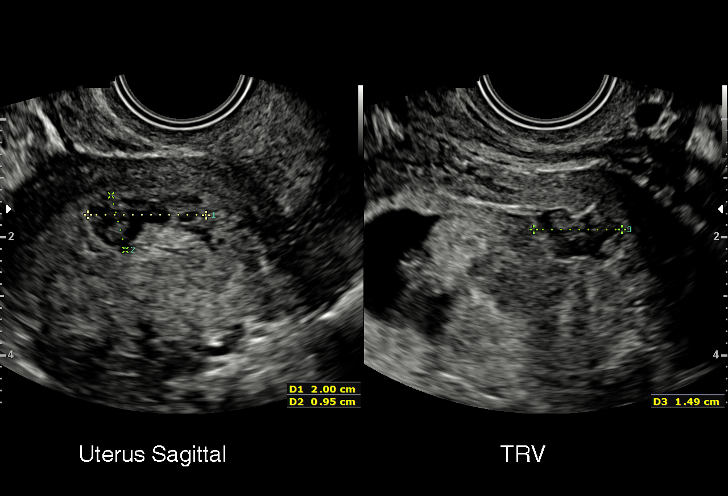
[im 32/42]
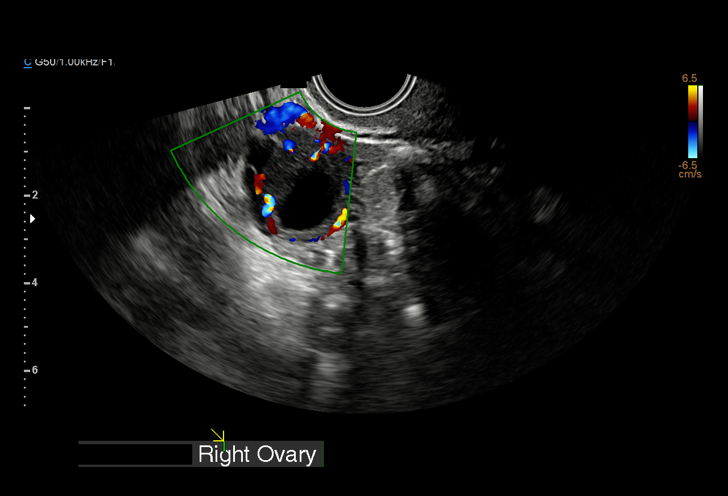
[im 35/42]
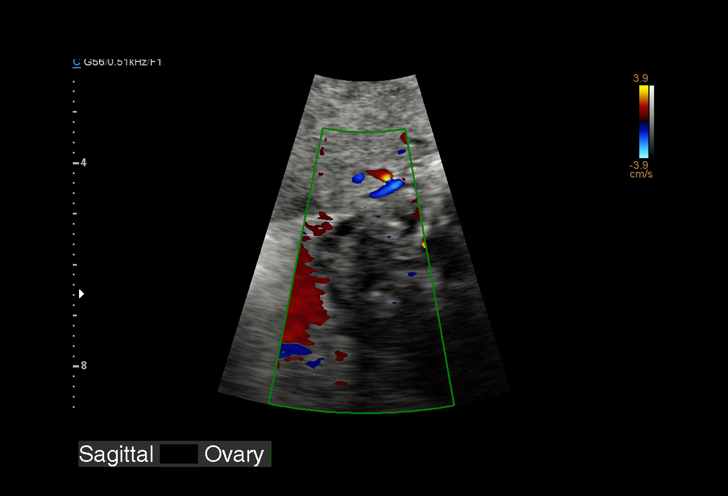
[im 38/42]
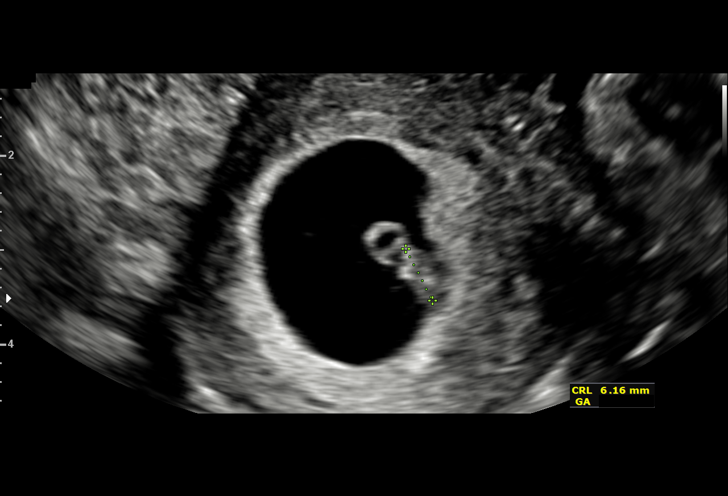
[im 42/42]
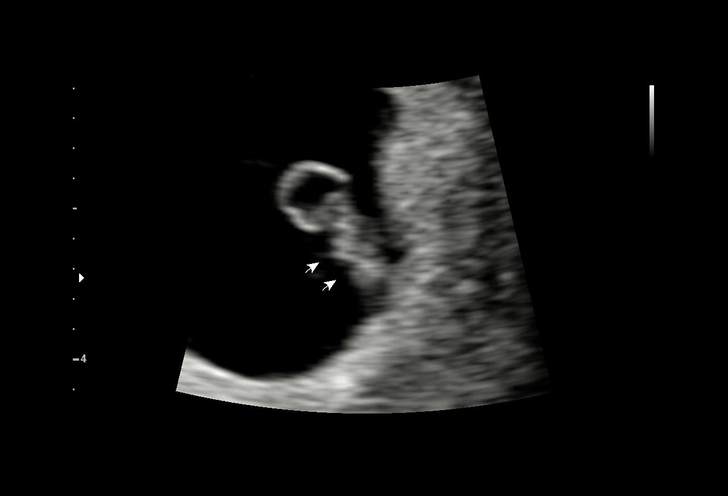

[15 of 28 positions shown; findings below may reference images not displayed]

FINDINGS: Intrauterine gestational sac: Single

Yolk sac:  Visualized.

Embryo:  Visualized.

Cardiac Activity: Visualized.

Heart Rate: 115 bpm

CRL:   5.7 mm   6 w 2 d                  US EDC: 05/26/2019

Subchorionic hemorrhage:  Small

Maternal uterus/adnexae: Again noted are multiple intrauterine
fibroids. The largest measuring 2.3 x 2.2 x 2.1 cm on the posterior
right. Probable corpus luteum cyst seen in the right ovary.
IMPRESSION: Single live intrauterine pregnancy measuring 6 weeks 2 days

Small subchorionic hemorrhage

## 2020-05-04 IMAGING — US US MFM OB DETAIL+14 WK
1 series · 12 of 28 positions shown · non-contrast
Comparison: none

[Series 1: us mfm ob detail+14 wk · 119 acquisitions, 12 frames shown]
[im 5/119]
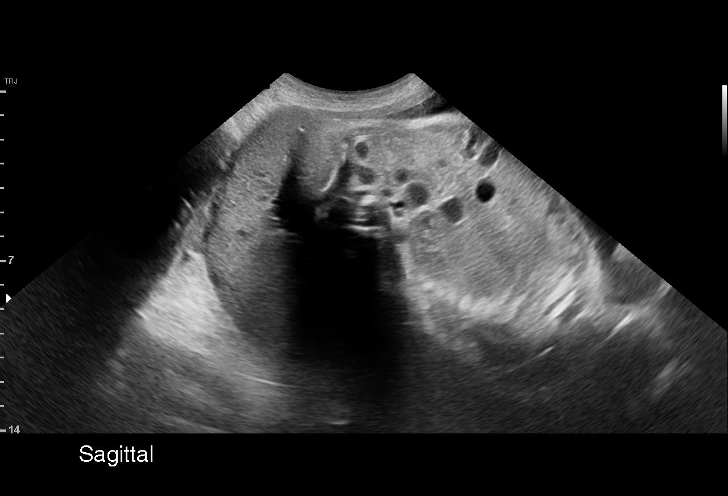
[im 14/119]
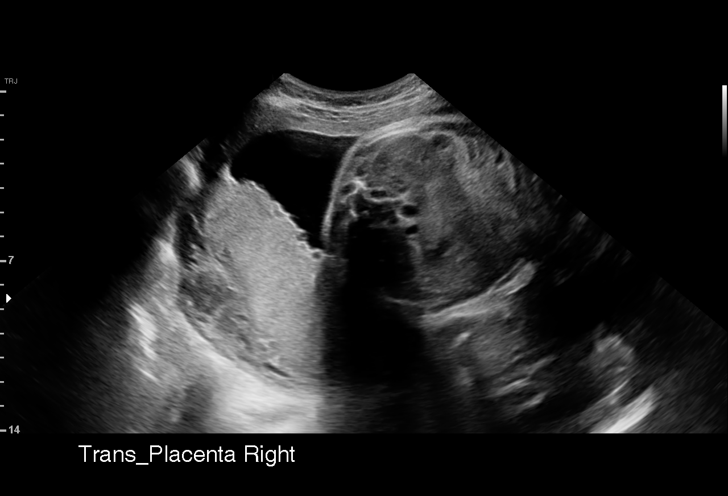
[im 22/119]
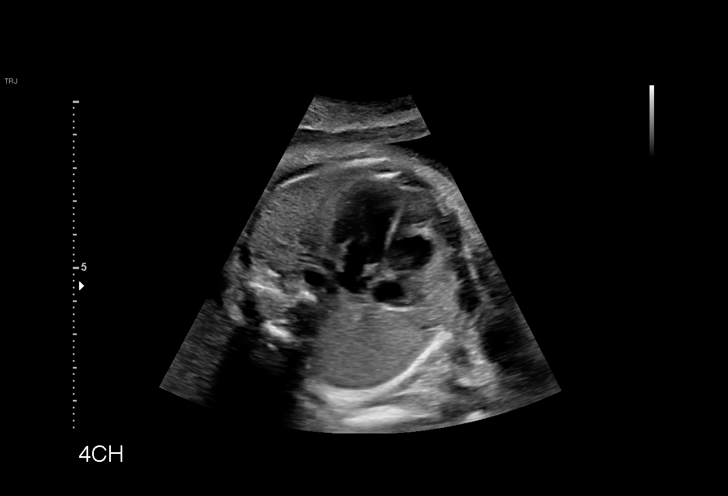
[im 35/119]
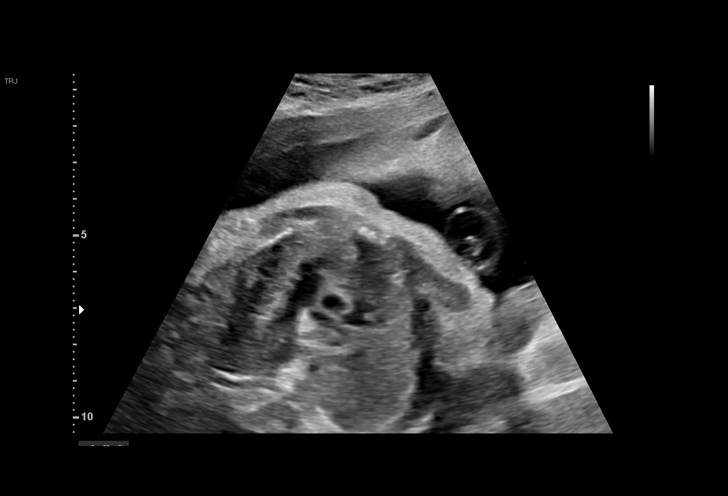
[im 44/119]
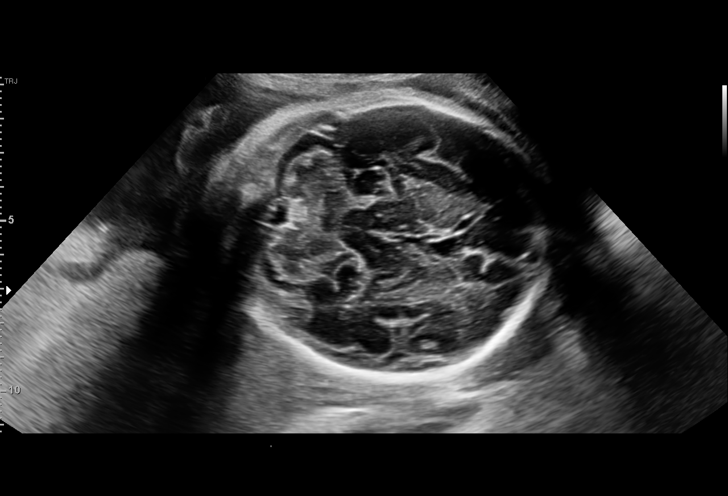
[im 53/119]
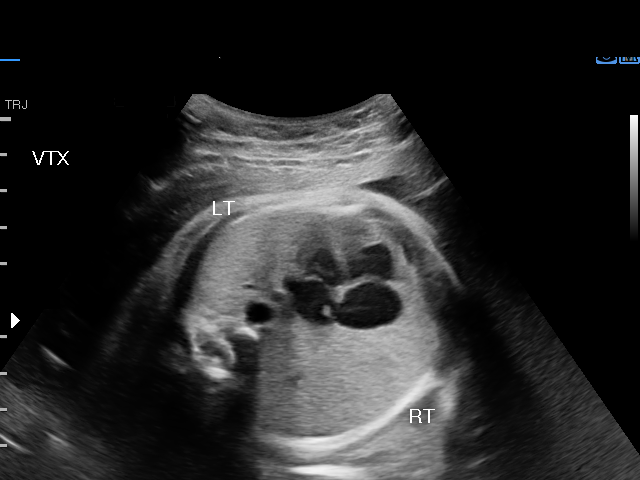
[im 66/119]
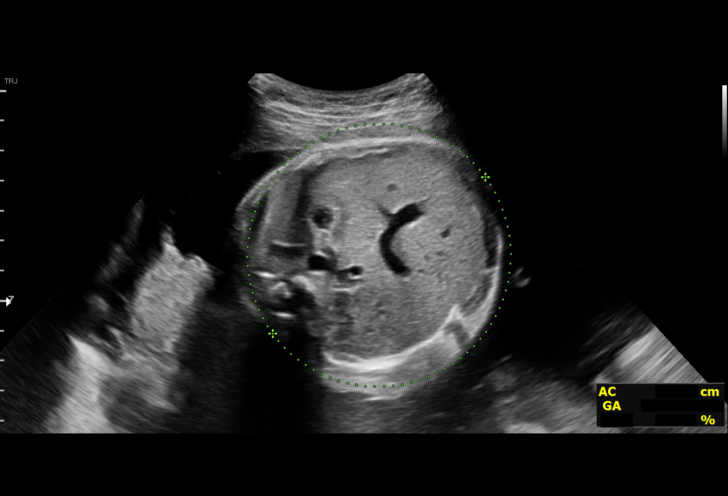
[im 75/119]
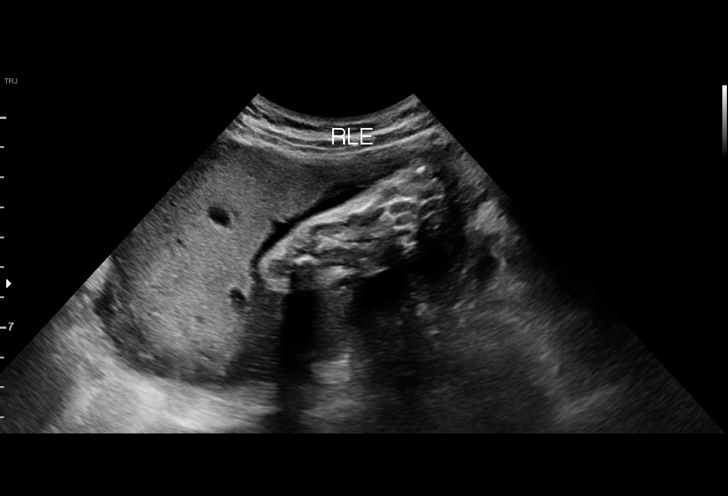
[im 84/119]
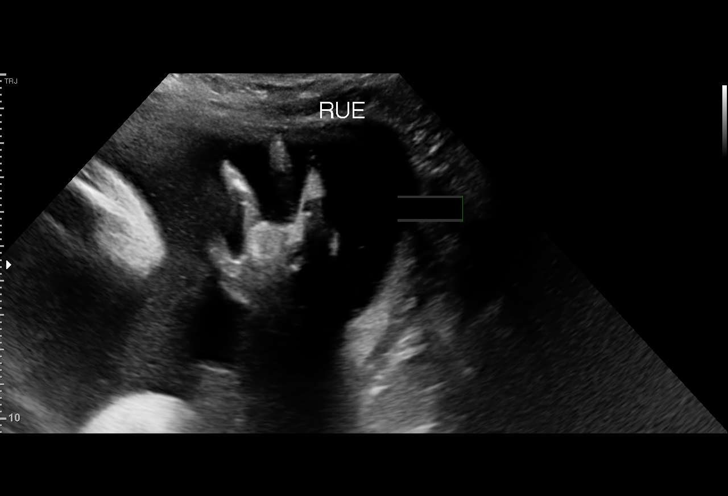
[im 97/119]
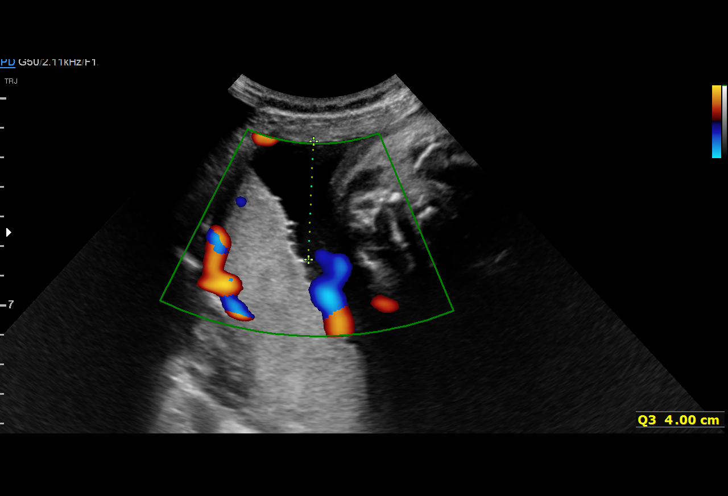
[im 105/119]
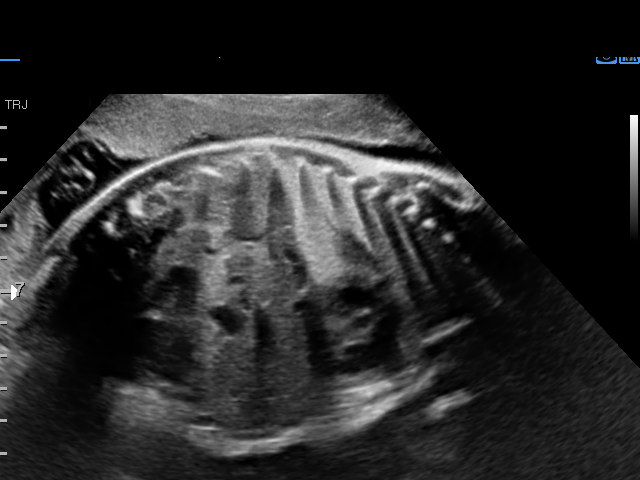
[im 114/119]
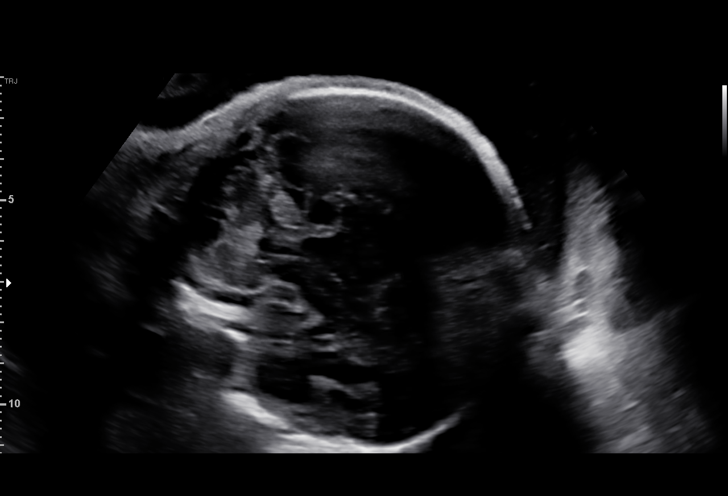

[12 of 28 positions shown; findings below may reference images not displayed]

Obstetrics &
                                                            Gynecology
                                                            [address]
                                                            [place].
                   CNM

 ----------------------------------------------------------------------

 ----------------------------------------------------------------------
Indications

  Advanced maternal age multigravida 35+,
  third trimester
  Uterine fibroids
  LR NIPS
  Encounter for antenatal screening for
  malformations
  Polyhydramnios, third trimester, antepartum
  condition or complication, fetus 1
  31 weeks gestation of pregnancy
 ----------------------------------------------------------------------
Fetal Evaluation

 Num Of Fetuses:         1
 Fetal Heart Rate(bpm):  153
 Cardiac Activity:       Observed
 Presentation:           Cephalic
 Placenta:               Posterior
 P. Cord Insertion:      Visualized, eccentric

 Amniotic Fluid
 AFI FV:      Within normal limits

 AFI Sum(cm)     %Tile       Largest Pocket(cm)
 21.58           84
 RUQ(cm)       RLQ(cm)       LUQ(cm)        LLQ(cm)
 6.63          5.15          5.8            4
Biophysical Evaluation

 Amniotic F.V:   Pocket => 2 cm             F. Tone:        Observed
 F. Movement:    Observed                   Score:          [DATE]
 F. Breathing:   Observed
Biometry

 BPD:      82.2  mm     G. Age:  33w 0d         91  %    CI:        74.97   %    70 - 86
                                                         FL/HC:      19.7   %    19.3 -
 HC:      301.2  mm     G. Age:  33w 3d         80  %    HC/AC:      1.06        0.96 -
 AC:       283   mm     G. Age:  32w 2d         83  %    FL/BPD:     72.0   %    71 - 87
 FL:       59.2  mm     G. Age:  30w 6d         32  %    FL/AC:      20.9   %    20 - 24
 HUM:      53.9  mm     G. Age:  31w 2d         60  %
 CER:      40.5  mm     G. Age:  34w 4d       > 95  %

 LV:        5.9  mm
 CM:        3.9  mm

 Est. FW:    7223  gm      4 lb 3 oz     74  %
OB History

 Gravidity:    2          SAB:   1
 Living:       0
Gestational Age

 LMP:           35w 5d        Date:  07/14/18                 EDD:   04/20/19
 U/S Today:     32w 3d                                        EDD:   05/13/19
 Best:          31w 0d     Det. By:  U/S G S  (09/21/18)      EDD:   05/23/19
Anatomy

 Cranium:               Appears normal         Aortic Arch:            Appears normal
 Cavum:                 Appears normal         Ductal Arch:            Appears normal
 Ventricles:            Appears normal         Diaphragm:              Appears normal
 Choroid Plexus:        Appears normal         Stomach:                Appears normal, left
                                                                       sided
 Cerebellum:            Appears normal         Abdomen:                Appears normal
 Posterior Fossa:       Appears normal         Abdominal Wall:         Appears nml (cord
                                                                       insert, abd wall)
 Nuchal Fold:           Appears normal         Cord Vessels:           Appears normal (3
                                                                       vessel cord)
 Face:                  Appears normal         Kidneys:                Appear normal
                        (orbits and profile)
 Lips:                  Appears normal         Bladder:                Appears normal
 Palate:                Appears normal         Spine:                  Appears normal
 Heart:                 Appears normal         Upper Extremities:      Appears normal
                        (4CH, axis, and
                        situs)
 RVOT:                  Appears normal         Lower Extremities:      Appears normal
 LVOT:                  Appears normal

 Other:  Fetus appears to be a male. Nasal bone visualized. Heels, open
         hands and 5th digit visualized. Maxilla visualized.
Cervix Uterus Adnexa
 Cervix
 Length:           3.86  cm.
 Normal appearance by transabdominal scan.

 Left Ovary
 Size(cm)       2.2  x   1.5    x  1.7       Vol(ml):
 Within normal limits.

 Right Ovary
 Size(cm)       2.1  x   1.3    x  1.5       Vol(ml):
 Within normal limits.1.5
Myomas

  Site                     L(cm)      W(cm)      D(cm)      Location
  Fundus                   2.3        3.5        1.3        Subserosal
  Posterior                3.3        2.3        2.4        Intramural
 ----------------------------------------------------------------------

  Blood Flow                 RI        PI       Comments

                                                Post to placenta
 ----------------------------------------------------------------------
Comments

 This patient was seen for a detailed fetal anatomy scan due
 to polyhydramnios that was noted on a recent exam in your
 office.  The patient reports that she has screened negative for
 gestational diabetes in her current pregnancy.  She denies
 any significant past medical history and denies any other
 problems in her current pregnancy.
 She had a cell free DNA test earlier in her pregnancy which
 indicated a low risk for trisomy 21, 18, and 13. A male fetus is
 predicted.
 She was informed that the fetal growth was appropriate for
 her gestational age.
 Borderline polyhydramnios was noted today with a total AFI
 of 21.58 cm.  There was a maximal vertical pocket of
 amniotic fluid that was greater than 9 cm today.
 The patient was advised that the borderline polyhydramnios
 noted today is most likely a normal variant as there were no
 anatomical structural anomalies noted today to cause
 polyhydramnios and as she has screened negative for
 gestational diabetes.  The increased risk of preterm labor and
 PPROM due to polyhydramnios was discussed.
 The patient was informed that anomalies may be missed due
 to technical limitations. If the fetus is in a suboptimal position
 or maternal habitus is increased, visualization of the fetus in
 the maternal uterus may be impaired.
 A biophysical profile performed today was [DATE].
 Due to borderline polyhydramnios, the patient should
 continue to be followed with ultrasounds in your office.  She
 should have another ultrasound to assess the fetal growth
 and amniotic fluid in 3 weeks.
 No further exams were scheduled in our office.

## 2020-10-09 ENCOUNTER — Inpatient Hospital Stay (HOSPITAL_COMMUNITY)
Admission: AD | Admit: 2020-10-09 | Discharge: 2020-10-10 | Disposition: A | Discharge status: Home / Self Care | Payer: No Typology Code available for payment source | Attending: Obstetrics and Gynecology | Admitting: Obstetrics and Gynecology

## 2020-10-09 ENCOUNTER — Inpatient Hospital Stay (HOSPITAL_COMMUNITY): Payer: No Typology Code available for payment source

## 2020-10-09 ENCOUNTER — Encounter (HOSPITAL_COMMUNITY): Payer: Self-pay

## 2020-10-09 DIAGNOSIS — O209 Hemorrhage in early pregnancy, unspecified: Secondary | ICD-10-CM | POA: Insufficient documentation

## 2020-10-09 DIAGNOSIS — O418X10 Other specified disorders of amniotic fluid and membranes, first trimester, not applicable or unspecified: Secondary | ICD-10-CM

## 2020-10-09 DIAGNOSIS — O09521 Supervision of elderly multigravida, first trimester: Secondary | ICD-10-CM | POA: Diagnosis not present

## 2020-10-09 DIAGNOSIS — O468X1 Other antepartum hemorrhage, first trimester: Secondary | ICD-10-CM

## 2020-10-09 DIAGNOSIS — N898 Other specified noninflammatory disorders of vagina: Secondary | ICD-10-CM | POA: Diagnosis not present

## 2020-10-09 DIAGNOSIS — Z3A01 Less than 8 weeks gestation of pregnancy: Secondary | ICD-10-CM | POA: Insufficient documentation

## 2020-10-09 DIAGNOSIS — O99891 Other specified diseases and conditions complicating pregnancy: Secondary | ICD-10-CM | POA: Diagnosis not present

## 2020-10-09 DIAGNOSIS — O4690 Antepartum hemorrhage, unspecified, unspecified trimester: Secondary | ICD-10-CM

## 2020-10-09 LAB — URINALYSIS, ROUTINE W REFLEX MICROSCOPIC
Bilirubin Urine: NEGATIVE
Glucose, UA: NEGATIVE mg/dL
Hgb urine dipstick: NEGATIVE
Ketones, ur: NEGATIVE mg/dL
Leukocytes,Ua: NEGATIVE
Nitrite: NEGATIVE
Protein, ur: NEGATIVE mg/dL
Specific Gravity, Urine: 1.024 (ref 1.005–1.030)
pH: 6 (ref 5.0–8.0)

## 2020-10-09 LAB — I-STAT BETA HCG BLOOD, ED (MC, WL, AP ONLY): I-stat hCG, quantitative: >2000 m[IU]/mL — ABNORMAL HIGH (ref <5)

## 2020-10-09 LAB — CBC
HCT: 35.5 % — ABNORMAL LOW (ref 36.0–46.0)
Hemoglobin: 12.2 g/dL (ref 12.0–15.0)
MCH: 32.5 pg (ref 26.0–34.0)
MCHC: 34.4 g/dL (ref 30.0–36.0)
MCV: 94.7 fL (ref 80.0–100.0)
Platelets: 246 10*3/uL (ref 150–400)
RBC: 3.75 MIL/uL — ABNORMAL LOW (ref 3.87–5.11)
RDW: 13.2 % (ref 11.5–15.5)
WBC: 8.7 10*3/uL (ref 4.0–10.5)
nRBC: 0 % (ref 0.0–0.2)

## 2020-10-09 LAB — WET PREP, GENITAL
Sperm: NONE SEEN
Trich, Wet Prep: NONE SEEN
Yeast Wet Prep HPF POC: NONE SEEN

## 2020-10-09 MED ORDER — METRONIDAZOLE 0.75 % VA GEL: 1.0000 | Freq: Every day | VAGINAL | 0 refills | Status: DC

## 2020-10-09 NOTE — ED Provider Notes (Signed)
Emergency Medicine Provider OB Triage Evaluation Note  Jeanette Kennedy is a 40 y.o. female, [id number], at Unknown gestation who presents to the emergency department with complaints of vaginal spotting.  LMP 7/14 - 7/21.  Patient states that since then she has had intermittent vaginal spotting as well as nausea.  Patient states that she took a at home pregnancy test today and it was positive.  Patient came to the emergency department due to need for repeat testing and concern for spotting.  G3 P1-0-1-1  Review of  Systems  Positive: Vaginal bleeding, nausea Negative: Vaginal pain, vaginal discharge, abdominal pain, vomiting, dysuria  Physical Exam  BP 123/87 (BP Location: Left Arm)   Pulse 79   Temp 98.4 F (36.9 C) (Oral)   Resp 16   LMP 09/17/2020   SpO2 100%  General: Awake, no distress  HEENT: Atraumatic  Resp: Normal effort  Cardiac: Normal rate Abd: Nondistended, nontender  MSK: Moves all extremities without difficulty Neuro: Speech clear  Medical Decision Making  Pt evaluated for pregnancy concern and is stable for transfer to [facility]. Pt is in agreement with plan for transfer.  8:51 PM Discussed with MAU APP, Danielle, who accepts patient in transfer.  Clinical Impression  No diagnosis found.     Dyann Ruddle 10/09/20 2056    Drenda Freeze, MD 10/09/20 [phone]

## 2020-10-09 NOTE — ED Triage Notes (Signed)
Been feeling nauseous. Took at home preg test and was positive. Denies any abdominal pain and dysuria.

## 2020-10-09 NOTE — ED Notes (Signed)
Transport called.

## 2020-10-09 NOTE — MAU Note (Signed)
..  Jeanette Kennedy is a 40 y.o. at 81w1dhere in [facility]U reporting: Vaginal spotting that is only there when she wipes, is concerned because she has had positive pregnancy tests. Denies abdominal cramping.   Pain score: 0/10 Vitals:   10/09/20 1925 10/09/20 2205  BP: 123/87 126/71  Pulse: 79 94  Resp: 16 17  Temp: 98.4 F (36.9 C) 98.4 F (36.9 C)  SpO2: 100% 100%

## 2020-10-09 NOTE — MAU Provider Note (Signed)
History     CSN: [id number]  Arrival date and time: 10/09/20 1823   Event Date/Time   First Provider Initiated Contact with Patient 10/09/20 2249      Chief Complaint  Patient presents with   Vaginal Bleeding   Jeanette Kennedy is a 40 y.o. G3P1011 at ~ 3 weeks by LMP.  She presents today for Vaginal Bleeding.  She states she had a menstrual cycle from July 14-21st and has been spotting every since.  She states the spotting is intermittent and usually only noted with wiping.  She reports it is dark brown in color and she has no pain or discomfort.  Patient states she took a UPT after several bouts of nausea.  She states she came in to evaluate b/c she has a history of miscarriage and she is worried.  Patient tearful during discussion with provider.     OB History     Gravida  3   Para  1   Term  1   Preterm      AB  1   Living  1      SAB  1   IAB      Ectopic      Multiple  0   Live Births  1           Past Medical History:  Diagnosis Date   Anemia    Medical history non-contributory     Past Surgical History:  Procedure Laterality Date   NO PAST SURGERIES      Family History  Problem Relation Age of Onset   Cancer Mother    Healthy Father     Social History   Tobacco Use   Smoking status: Never   Smokeless tobacco: Never  Vaping Use   Vaping Use: Never used  Substance Use Topics   Alcohol use: Not Currently    Comment: socailly   Drug use: Never    Allergies: No Known Allergies  Medications Prior to Admission  Medication Sig Dispense Refill Last Dose   Prenatal Vit-Fe Fumarate-FA (PRENATAL MULTIVITAMIN) TABS tablet Take 1 tablet by mouth daily at 12 noon.   10/09/2020   ibuprofen (ADVIL) 600 MG tablet Take 1 tablet (600 mg total) by mouth every 6 (six) hours. 30 tablet 0    Iron-FA-B Cmp-C-Biot-Probiotic (FUSION PLUS) CAPS Take 1 capsule by mouth daily.       Review of Systems  Gastrointestinal:  Positive for nausea. Negative for  abdominal pain and vomiting.  Genitourinary:  Positive for vaginal bleeding. Negative for difficulty urinating, dysuria and vaginal discharge.  Neurological:  Negative for dizziness, light-headedness and headaches.  Physical Exam   Blood pressure 126/71, pulse 94, temperature 98.4 F (36.9 C), temperature source Oral, resp. rate 17, height '5\' 3"'$  (1.6 m), weight 49.7 kg, last menstrual period 09/17/2020, SpO2 100 %, unknown if currently breastfeeding.  Physical Exam Vitals reviewed.  Constitutional:      General: She is in acute distress (Tearful).     Appearance: Normal appearance.  HENT:     Head: Normocephalic and atraumatic.  Eyes:     Conjunctiva/sclera: Conjunctivae normal.  Cardiovascular:     Rate and Rhythm: Normal rate.  Pulmonary:     Effort: Pulmonary effort is normal. No respiratory distress.  Musculoskeletal:        General: Normal range of motion.     Cervical back: Normal range of motion.  Skin:    General: Skin is warm and dry.  Neurological:     Mental Status: She is alert and oriented to person, place, and time.  Psychiatric:        Mood and Affect: Mood normal.        Behavior: Behavior normal.        Thought Content: Thought content normal.    MAU Course  Procedures Results for orders placed or performed during the hospital encounter of 10/09/20 (from the past 24 hour(s))  I-Stat Beta hCG blood, ED ([facility], [facility], [facility] only)     Status: Abnormal   Collection Time: 10/09/20  7:45 PM  Result Value Ref Range   I-stat hCG, quantitative >2,000.0 (H) <5 mIU/mL   Comment 3          Urinalysis, Routine w reflex microscopic     Status: Abnormal   Collection Time: 10/09/20 10:11 PM  Result Value Ref Range   Color, Urine YELLOW YELLOW   APPearance HAZY (A) CLEAR   Specific Gravity, Urine 1.024 1.005 - 1.030   pH 6.0 5.0 - 8.0   Glucose, UA NEGATIVE NEGATIVE mg/dL   Hgb urine dipstick NEGATIVE NEGATIVE   Bilirubin Urine NEGATIVE NEGATIVE   Ketones, ur NEGATIVE  NEGATIVE mg/dL   Protein, ur NEGATIVE NEGATIVE mg/dL   Nitrite NEGATIVE NEGATIVE   Leukocytes,Ua NEGATIVE NEGATIVE  CBC     Status: Abnormal   Collection Time: 10/09/20 10:17 PM  Result Value Ref Range   WBC 8.7 4.0 - 10.5 K/uL   RBC 3.75 (L) 3.87 - 5.11 MIL/uL   Hemoglobin 12.2 12.0 - 15.0 g/dL   HCT 35.5 (L) 36.0 - 46.0 %   MCV 94.7 80.0 - 100.0 fL   MCH 32.5 26.0 - 34.0 pg   MCHC 34.4 30.0 - 36.0 g/dL   RDW 13.2 11.5 - 15.5 %   Platelets 246 150 - 400 K/uL   nRBC 0.0 0.0 - 0.2 %  Wet prep, genital     Status: Abnormal   Collection Time: 10/09/20 10:40 PM   Specimen: Vaginal  Result Value Ref Range   Yeast Wet Prep HPF POC NONE SEEN NONE SEEN   Trich, Wet Prep NONE SEEN NONE SEEN   Clue Cells Wet Prep HPF POC PRESENT (A) NONE SEEN   WBC, Wet Prep HPF POC MANY (A) NONE SEEN   Sperm NONE SEEN    US OB Comp Less 14 Wks  Result Date: 10/09/2020 CLINICAL DATA:  Vaginal bleeding EXAM: OBSTETRIC <14 WK ULTRASOUND TECHNIQUE: Transabdominal ultrasound was performed for evaluation of the gestation as well as the maternal uterus and adnexal regions. COMPARISON:  Ultrasound of prior gestation 03/21/2019 FINDINGS: Intrauterine gestational sac: Single Yolk sac:  Visualized. Embryo:  Visualized. Cardiac Activity: Visualized. Heart Rate: 121 bpm CRL:   4 mm   6 w 0 d                  [place] EDC: 06/04/2021 Subchorionic hemorrhage: Small volume subchorionic hemorrhage is noted (10/44). Maternal uterus/adnexae: Anteverted maternal uterus. No other concerning focal uterine abnormality. Probable right corpus luteum cyst is noted. No worrisome adnexal lesions. No pelvic free fluid. IMPRESSION: Single intrauterine gestation at 6 weeks, 0 days by crown-rump length sonographic estimation. Small volume subchorionic hemorrhage. No other acute sonographic complication. Electronically Signed   By: Lovena Le M.D.   On: 10/09/2020 23:33     MDM Wet Prep and GC/CT Labs: UA, UPT, CBC Ultrasound Assessment and  Plan  40 year old G3P1011 at 3 weeks Vaginal Bleeding O  Positive  -Provider offers comfort to patient. -Informed of POC including: *Labs *[place] -Cautioned that additional follow up may be necessary before definite diagnosis can be given. -Nurse to collect swabs blindly. -Labs ordered. -Patient without pain. -No questions. -Will await results.   Maryann Conners 10/09/2020, 10:49 PM   Reassessment (11:50 PM) SIUP at 6.0 weeks [facility] +Clue Cells  -Provider to bedside to discuss results. -Congratulations given. -Educated on [facility], what to expect including bleeding, risks for miscarriage, and resolution.  -Encouraged pelvic rest for at least 72 hours after cessation of bleeding.  -Informed of wet prep findings and recommendation for treatment. -Patient agreeable. -Rx for metrogel sent to pharmacy on file.  -Cautioned that insertion may cause some bleeding and irritation. -Encouraged to call or return to MAU if symptoms worsen or with the onset of new symptoms. -Discharged to home in stable condition.  Maryann Conners MSN, CNM Advanced Practice Provider, [facility]

## 2020-10-10 DIAGNOSIS — O209 Hemorrhage in early pregnancy, unspecified: Secondary | ICD-10-CM | POA: Diagnosis not present

## 2020-10-10 LAB — HCG, QUANTITATIVE, PREGNANCY: hCG, Beta Chain, Quant, S: 75971 m[IU]/mL — ABNORMAL HIGH (ref <5)

## 2020-10-12 LAB — GC/CHLAMYDIA PROBE AMP (~~LOC~~) NOT AT ARMC
Chlamydia: NEGATIVE
Comment: NEGATIVE
Comment: NORMAL
Neisseria Gonorrhea: NEGATIVE

## 2020-11-12 LAB — OB RESULTS CONSOLE HIV ANTIBODY (ROUTINE TESTING): HIV: NONREACTIVE

## 2020-11-12 LAB — OB RESULTS CONSOLE RPR: RPR: NONREACTIVE

## 2020-11-12 LAB — OB RESULTS CONSOLE RUBELLA ANTIBODY, IGM: Rubella: IMMUNE

## 2020-11-12 LAB — HEPATITIS C ANTIBODY: HCV Ab: NEGATIVE

## 2020-11-12 LAB — OB RESULTS CONSOLE HEPATITIS B SURFACE ANTIGEN: Hepatitis B Surface Ag: NEGATIVE

## 2020-11-12 LAB — OB RESULTS CONSOLE ABO/RH: RH Type: POSITIVE

## 2020-11-12 LAB — OB RESULTS CONSOLE GC/CHLAMYDIA: Gonorrhea: NEGATIVE

## 2021-01-07 ENCOUNTER — Telehealth: Payer: Self-pay

## 2021-01-07 NOTE — Telephone Encounter (Signed)
sw patient 11/3-says she has already completed this exam and is scheduled there for her follow up in 4 weeks-please cancel this request.

## 2021-03-07 NOTE — L&D Delivery Note (Signed)
Operative Delivery Note ?At 6:50 AM a viable female was delivered via Vaginal, Vacuum Neurosurgeon).  Presentation: vertex; Position: Occiput,, Anterior; Station: +2. ? ?Verbal consent: obtained from patient.  Due to deep variable decelerations to the 60's with pushing.  Risks and benefits discussed in detail.  Risks include, but are not limited to the risks of anesthesia, bleeding, infection, damage to maternal tissues, fetal cephalhematoma.  There is also the risk of inability to effect vaginal delivery of the head, or shoulder dystocia that cannot be resolved by established maneuvers, leading to the need for emergency cesarean section. ? ?Kiwi vacuum was applied. Newborn was delivered after 2 pushes with no pop office. The suction was immediately released. The rest of the body was delivered without complication. Baby was placed on mothers chest for skin to skin. The remaining portion of the delivery was performed by Noralyn Pick.  ? ?APGAR: 9 at 1 minutes and 9 at 5 minuts. , ; weight  pending.   ?Placenta status: , .   ?Cord:  with the following complications:None .  Cord pH: Not collected ? ?Anesthesia:  epidural  ?Instruments: Kiwi vacuum used.. no pop offs  ?Episiotomy:Right mediolateral episiotomy performed.  ?Lacerations: right mediolateral episiotomy   ?Suture Repair: 3.0 vicryl ?Est. Blood Loss (mL):  pending  ? ?Mom to postpartum.  Baby to Couplet care / Skin to Skin. ? ?Jeanette Kennedy ?05/29/2021, 6:55 AM ? ? ? ?

## 2021-03-07 NOTE — L&D Delivery Note (Signed)
Delivery Note with OVD>  ? ? ?Patient Name: Jeanette Kennedy ?DOB: 01-31-1981 ?MRN: [id number] ? ?Date of admission: 05/28/2021 ?Delivering MD: Christophe Louis  ?Date of delivery: 05/29/21 ?Type of delivery: SVD ? ?Newborn Data: ?Live born female  ?Birth Weight:   ?APGAR: 9, 9 ? ?Newborn Delivery   ?Birth date/time: 05/29/2021 06:50:00 ?Delivery type: Vaginal, Vacuum (Extractor) ?  ?  ? ?Jeanette Kennedy, 41 y.o., @ [redacted]w[redacted]d  [id number] who was admitted for Elective IOL, progressed with pitocin and AROM. I was called to the room when she progressed +1 station in the second stage of labor.  Pt felt urge to push and was not able to control. Cat 2 strip noted with decels to 60s during pushing, moderate variability, DR CLandry Mellowcalled and en route for vacuumed assist, labor down was used. Several variable noted to 60 with no pushing but not regular, position change resolved. DR CLandry Mellowarrived and performed a mediolateral epis and vacuumed with NICU present in the room as well. She pushed for 18 mins prior to vacuumed then pushed for 5 min with vaccume, no pop offs, see DR CLandry MellowOVD.  She delivered a viable infant, cephalic and restituted to the LOA position over an intact perineum.  A nuchal cord   was not identified. The baby was placed on maternal abdomen while initial step of NRP were perfmored (Dry, Stimulated, and warmed). Hat placed on baby for thermoregulation. Delayed cord clamping was performed for 2 minutes.  Cord double clamped and cut.  Cord cut by FOB. Apgar scores were 9 and 9. Prophylactic Pitocin was started in the third stage of labor for active management. The placenta delivered spontaneously, shultz, with a 3 vessel cord and was sent to LD.  Inspection revealed 2nd degree. An examination of the vaginal vault and cervix was free from lacerations. The uterus was firm, bleeding stable.  The repair was done under lidocaine and epidural.   Placenta and umbilical artery blood gas were not sent.  There were no complications during  the procedure.  Mom and baby skin to skin following delivery. Left in stable condition. ? ?Maternal Info: ?Anesthesia: Epidural ?Episiotomy: medo-lat ?Lacerations:  2nd with medio-lat ?Suture Repair: yes, 3.0 vicryl rapid CT ?Est. Blood Loss (mL):  587m ? ?Newborn Info: ? ?Baby Sex: female ?Circumcision: N/A ?APGAR (1 MIN): 9   ?APGAR (5 MINS): 9   ?APGAR (10 MINS):   ? ? ?Mom to postpartum.  Baby to Couplet care / Skin to Skin. ? ? ?JaNew Lexington Clinic PscCNNorth DakotaNP-C ?05/29/21 ?7:21 AM ? ? ? ?  ?

## 2021-05-06 LAB — OB RESULTS CONSOLE GBS: GBS: NEGATIVE

## 2021-05-15 ENCOUNTER — Inpatient Hospital Stay (HOSPITAL_COMMUNITY)
Admission: AD | Admit: 2021-05-15 | Discharge: 2021-05-15 | Disposition: A | Discharge status: Home / Self Care | Payer: No Typology Code available for payment source | Attending: Obstetrics and Gynecology | Admitting: Obstetrics and Gynecology

## 2021-05-15 ENCOUNTER — Encounter (HOSPITAL_COMMUNITY): Payer: Self-pay | Admitting: Obstetrics and Gynecology

## 2021-05-15 ENCOUNTER — Inpatient Hospital Stay (EMERGENCY_DEPARTMENT_HOSPITAL)
Admission: AD | Admit: 2021-05-15 | Discharge: 2021-05-16 | Disposition: A | Discharge status: Home / Self Care | Payer: No Typology Code available for payment source | Source: Home / Self Care | Attending: Obstetrics and Gynecology | Admitting: Obstetrics and Gynecology

## 2021-05-15 ENCOUNTER — Other Ambulatory Visit: Payer: Self-pay

## 2021-05-15 DIAGNOSIS — Z3A37 37 weeks gestation of pregnancy: Secondary | ICD-10-CM | POA: Insufficient documentation

## 2021-05-15 DIAGNOSIS — O471 False labor at or after 37 completed weeks of gestation: Secondary | ICD-10-CM | POA: Insufficient documentation

## 2021-05-15 DIAGNOSIS — O09523 Supervision of elderly multigravida, third trimester: Secondary | ICD-10-CM | POA: Insufficient documentation

## 2021-05-15 DIAGNOSIS — O479 False labor, unspecified: Secondary | ICD-10-CM | POA: Diagnosis not present

## 2021-05-15 DIAGNOSIS — Z3689 Encounter for other specified antenatal screening: Secondary | ICD-10-CM | POA: Diagnosis not present

## 2021-05-15 NOTE — MAU Note (Signed)
Jeanette Kennedy is a 41 y.o. at 80w1dhere in MAU reporting: having contractions, getting closer and more intense, ?220m now . No bleeding, no LOF.  Was 1 cm 2 wks ago. ? ?Onset of complaint: started contracting on Thurs, intense today ?Pain score: 6 ?Vitals:  ? 05/15/21 1247  ?BP: 119/70  ?Pulse: 87  ?Resp: 18  ?Temp: 97.9 ?F (36.6 ?C)  ?SpO2: 100%  ?   ?FHT:142 ?  ?

## 2021-05-15 NOTE — MAU Provider Note (Signed)
Jeanette Kennedy is a [id number] at 50w1dseen in MAU for labor. RN labor check, not seen by provider. SVE by RN Dilation: 2.5 ?Effacement (%): 50 ?Cervical Position: Middle ?Station: -3 ?Presentation: Undeterminable ?Exam by:: n druebbisch rn  ? ?NST - FHR: 135 bpm / moderate variability / accels present / decels absent / TOCO: regular every 1-3 mins  ? ?Plan: ? ?D/C home with labor precautions ?[facility] ? ?RLaury Deep CNM  ?05/15/2021 3:47 PM  ? ?

## 2021-05-16 DIAGNOSIS — O479 False labor, unspecified: Secondary | ICD-10-CM

## 2021-05-16 DIAGNOSIS — Z3A37 37 weeks gestation of pregnancy: Secondary | ICD-10-CM

## 2021-05-16 DIAGNOSIS — O471 False labor at or after 37 completed weeks of gestation: Secondary | ICD-10-CM | POA: Diagnosis not present

## 2021-05-16 NOTE — MAU Note (Signed)
Pt reports contractions that are 2-3 minutes apart and increasing in intensity since approximately 8 pm. Denies LOF or bleeding, endorses fetal movement.  ?

## 2021-05-16 NOTE — MAU Provider Note (Signed)
S: Patient is here for RN labor evaluation. Fetal tracing, vital signs, & chart reviewed  ? ?O:  ?Vitals:  ? 05/15/21 2350  ?BP: 111/79  ?Pulse: 96  ? ?No results found for this or any previous visit (from the past 24 hour(s)). ? ?Dilation: 3 ?Effacement (%): 50 ?Cervical Position: Middle ?Station: -3 ?Exam by:: Conan Bowens, RN ? ? ?FHR: 135 bpm bpm, Mod Variability, no Decels, 15x15 Accels ?UC: Occasional contraction ? ? ?A: ?1. False labor   ?2. [redacted] weeks gestation of pregnancy   ? ? ? ?P:  ?RN to discharge home in stable condition with return precautions & fetal kick counts ? ?Noni Saupe, NP ?05/16/2021 ?5:21 AM  ?

## 2021-05-16 NOTE — Progress Notes (Signed)
I have communicated with Altamease Oiler, NP and reviewed vital signs:  ?Vitals:  ? 05/15/21 2350  ?BP: 111/79  ?Pulse: 96  ?  ?Vaginal exam:  Dilation: 3 ?Effacement (%): 50 ?Cervical Position: Middle ?Station: -3 ?Exam by:: Conan Bowens, RN,  ? ?Also reviewed contraction pattern and that non-stress test is reactive.  It has been documented that patient is contracting every 2-5 minutes with no cervical change over 2.5 hours not indicating active labor.  Patient denies any other complaints.  Based on this report provider has given order for discharge.  A discharge order and diagnosis entered by a provider.   ?Labor discharge instructions reviewed with patient.     ? ? ? ?  ?

## 2021-05-28 ENCOUNTER — Inpatient Hospital Stay (HOSPITAL_COMMUNITY)
Admission: AD | Admit: 2021-05-28 | Discharge: 2021-05-30 | DRG: 806 | Disposition: A | Discharge status: Home / Self Care | Payer: No Typology Code available for payment source | Attending: Obstetrics & Gynecology | Admitting: Obstetrics & Gynecology

## 2021-05-28 ENCOUNTER — Encounter (HOSPITAL_COMMUNITY): Payer: Self-pay | Admitting: Obstetrics and Gynecology

## 2021-05-28 ENCOUNTER — Other Ambulatory Visit: Payer: Self-pay

## 2021-05-28 DIAGNOSIS — Z3A39 39 weeks gestation of pregnancy: Secondary | ICD-10-CM

## 2021-05-28 DIAGNOSIS — O9081 Anemia of the puerperium: Secondary | ICD-10-CM | POA: Diagnosis not present

## 2021-05-28 DIAGNOSIS — Z3490 Encounter for supervision of normal pregnancy, unspecified, unspecified trimester: Secondary | ICD-10-CM | POA: Diagnosis present

## 2021-05-28 DIAGNOSIS — Z0371 Encounter for suspected problem with amniotic cavity and membrane ruled out: Secondary | ICD-10-CM

## 2021-05-28 DIAGNOSIS — O288 Other abnormal findings on antenatal screening of mother: Secondary | ICD-10-CM

## 2021-05-28 DIAGNOSIS — D62 Acute posthemorrhagic anemia: Secondary | ICD-10-CM | POA: Diagnosis not present

## 2021-05-28 DIAGNOSIS — O26893 Other specified pregnancy related conditions, third trimester: Secondary | ICD-10-CM | POA: Diagnosis present

## 2021-05-28 LAB — CBC
HCT: 31.5 % — ABNORMAL LOW (ref 36.0–46.0)
Hemoglobin: 10.9 g/dL — ABNORMAL LOW (ref 12.0–15.0)
MCH: 33.5 pg (ref 26.0–34.0)
MCHC: 34.6 g/dL (ref 30.0–36.0)
MCV: 96.9 fL (ref 80.0–100.0)
Platelets: 252 10*3/uL (ref 150–400)
RBC: 3.25 MIL/uL — ABNORMAL LOW (ref 3.87–5.11)
RDW: 13.4 % (ref 11.5–15.5)
WBC: 12 10*3/uL — ABNORMAL HIGH (ref 4.0–10.5)
nRBC: 0 % (ref 0.0–0.2)

## 2021-05-28 LAB — TYPE AND SCREEN
ABO/RH(D): O POS
Antibody Screen: NEGATIVE

## 2021-05-28 LAB — POCT FERN TEST: POCT Fern Test: NEGATIVE

## 2021-05-28 MED ORDER — EPHEDRINE 5 MG/ML INJ: 10.0000 mg | INTRAVENOUS | Status: DC | PRN

## 2021-05-28 MED ORDER — SOD CITRATE-CITRIC ACID 500-334 MG/5ML PO SOLN: 30.0000 mL | ORAL | Status: DC | PRN

## 2021-05-28 MED ORDER — ONDANSETRON HCL 4 MG/2ML IJ SOLN
4.0000 mg | Freq: Four times a day (QID) | INTRAMUSCULAR | Status: DC | PRN
  Administered 2021-05-29: 4 mg via INTRAVENOUS
  Filled 2021-05-28: qty 2

## 2021-05-28 MED ORDER — OXYTOCIN BOLUS FROM INFUSION
333.0000 mL | Freq: Once | INTRAVENOUS | Status: AC
  Administered 2021-05-29: 333 mL via INTRAVENOUS

## 2021-05-28 MED ORDER — OXYTOCIN-SODIUM CHLORIDE 30-0.9 UT/500ML-% IV SOLN
2.5000 [IU]/h | INTRAVENOUS | Status: DC
  Administered 2021-05-29: 2.5 [IU]/h via INTRAVENOUS

## 2021-05-28 MED ORDER — LACTATED RINGERS IV SOLN: INTRAVENOUS | Status: DC

## 2021-05-28 MED ORDER — OXYTOCIN-SODIUM CHLORIDE 30-0.9 UT/500ML-% IV SOLN
1.0000 m[IU]/min | INTRAVENOUS | Status: DC
  Administered 2021-05-29: 1 m[IU]/min via INTRAVENOUS
  Filled 2021-05-28: qty 500

## 2021-05-28 MED ORDER — LACTATED RINGERS IV BOLUS
1000.0000 mL | Freq: Once | INTRAVENOUS | Status: AC
  Administered 2021-05-28: 1000 mL via INTRAVENOUS

## 2021-05-28 MED ORDER — DIPHENHYDRAMINE HCL 50 MG/ML IJ SOLN: 12.5000 mg | INTRAMUSCULAR | Status: DC | PRN

## 2021-05-28 MED ORDER — LACTATED RINGERS IV SOLN: 500.0000 mL | INTRAVENOUS | Status: DC | PRN

## 2021-05-28 MED ORDER — PHENYLEPHRINE 40 MCG/ML (10ML) SYRINGE FOR IV PUSH (FOR BLOOD PRESSURE SUPPORT)
80.0000 ug | PREFILLED_SYRINGE | INTRAVENOUS | Status: DC | PRN
  Filled 2021-05-28: qty 10

## 2021-05-28 MED ORDER — OXYCODONE-ACETAMINOPHEN 5-325 MG PO TABS: 2.0000 | ORAL_TABLET | ORAL | Status: DC | PRN

## 2021-05-28 MED ORDER — PHENYLEPHRINE 40 MCG/ML (10ML) SYRINGE FOR IV PUSH (FOR BLOOD PRESSURE SUPPORT)
80.0000 ug | PREFILLED_SYRINGE | INTRAVENOUS | Status: AC | PRN
Start: 2021-05-28 — End: 2021-05-29
  Administered 2021-05-29 (×3): 80 ug via INTRAVENOUS

## 2021-05-28 MED ORDER — LACTATED RINGERS IV SOLN
500.0000 mL | Freq: Once | INTRAVENOUS | Status: AC
  Administered 2021-05-28: 500 mL via INTRAVENOUS

## 2021-05-28 MED ORDER — LIDOCAINE HCL (PF) 1 % IJ SOLN
30.0000 mL | INTRAMUSCULAR | Status: AC | PRN
  Administered 2021-05-29: 30 mL via SUBCUTANEOUS
  Filled 2021-05-28: qty 30

## 2021-05-28 MED ORDER — OXYCODONE-ACETAMINOPHEN 5-325 MG PO TABS: 1.0000 | ORAL_TABLET | ORAL | Status: DC | PRN

## 2021-05-28 MED ORDER — FENTANYL CITRATE (PF) 100 MCG/2ML IJ SOLN: 50.0000 ug | INTRAMUSCULAR | Status: DC | PRN

## 2021-05-28 MED ORDER — ACETAMINOPHEN 325 MG PO TABS: 650.0000 mg | ORAL_TABLET | ORAL | Status: DC | PRN

## 2021-05-28 MED ORDER — FENTANYL-BUPIVACAINE-NACL 0.5-0.125-0.9 MG/250ML-% EP SOLN
12.0000 mL/h | EPIDURAL | Status: DC | PRN
  Administered 2021-05-29: 12 mL/h via EPIDURAL
  Filled 2021-05-28: qty 250

## 2021-05-28 MED ORDER — TERBUTALINE SULFATE 1 MG/ML IJ SOLN: 0.2500 mg | Freq: Once | INTRAMUSCULAR | Status: DC | PRN

## 2021-05-28 NOTE — MAU Provider Note (Signed)
?History  ?  ? ?[id number] ? ?Arrival date and time: 05/28/21 1634 ?  ? ?Chief Complaint  ?Patient presents with  ? Contractions  ? Rupture of Membranes  ? ? ? ?HPI ?Jeanette Kennedy is a 41 y.o. at 68w0dby 6 week ultrasound who presents for contractions & vaginal discharge. ?Earlier today she felt a pop & noticed clear discharge; unsure if water broke. Also reports painful contractions that she rates 6/10. Can't tell how frequent they are. Denies fever, n/v/d, or vaginal bleeding. Reports good fetal movement. States she was dilated 4 cm in the office yesterday when VJocelyn Lamerstripped her membranes ([facility]).   ? ?OB History   ? ? Gravida  ?3  ? Para  ?1  ? Term  ?1  ? Preterm  ?   ? AB  ?1  ? Living  ?1  ?  ? ? SAB  ?1  ? IAB  ?   ? Ectopic  ?   ? Multiple  ?0  ? Live Births  ?1  ?   ?  ?  ? ? ?Past Medical History:  ?Diagnosis Date  ? Anemia   ? ? ?Past Surgical History:  ?Procedure Laterality Date  ? OTHER SURGICAL HISTORY    ? had a vaginal surgery when she was 89 maybe a laceration  ? ? ?Family History  ?Problem Relation Age of Onset  ? Cancer Mother   ? Healthy Father   ? ? ?No Known Allergies ? ?No current facility-administered medications on file prior to encounter.  ? ?Current Outpatient Medications on File Prior to Encounter  ?Medication Sig Dispense Refill  ? ferrous sulfate 325 (65 FE) MG tablet Take 325 mg by mouth daily.    ? Prenatal Vit-Fe Fumarate-FA (PRENATAL MULTIVITAMIN) TABS tablet Take 1 tablet by mouth daily at 12 noon.    ? ? ? ?ROS ?Pertinent positives and negative per HPI, all others reviewed and negative ? ?Physical Exam  ? ?BP 124/84 (BP Location: Right Arm)   Pulse 92   Temp 97.9 ?F (36.6 ?C) (Oral)   Resp 16   LMP 09/17/2020   SpO2 97%  ? ?Patient Vitals for the past 24 hrs: ? BP Temp Temp src Pulse Resp SpO2  ?05/28/21 1654 124/84 97.9 ?F (36.6 ?C) Oral 92 16 97 %  ? ? ?Physical Exam ?Vitals and nursing note reviewed. Exam conducted with a chaperone present.  ?Constitutional:   ?    General: She is not in acute distress. ?   Appearance: Normal appearance.  ?HENT:  ?   Head: Normocephalic and atraumatic.  ?Eyes:  ?   General: No scleral icterus. ?   Conjunctiva/sclera: Conjunctivae normal.  ?Pulmonary:  ?   Effort: Pulmonary effort is normal. No respiratory distress.  ?Abdominal:  ?   Palpations: Abdomen is soft.  ?   Tenderness: There is no abdominal tenderness.  ?Genitourinary: ?   Comments: Sterile speculum exam: NEFG, moderate amount of yellow mucoid discharge. No pooling of fluid. No blood.  ?Skin: ?   General: Skin is warm and dry.  ?Neurological:  ?   General: No focal deficit present.  ?   Mental Status: She is alert.  ?Psychiatric:     ?   Mood and Affect: Mood normal.     ?   Behavior: Behavior normal.  ?  ? ?Cervical Exam ?Dilation: 4 ?Effacement (%): 50 ?Cervical Position: Posterior ?Station: -3 ?Presentation: Vertex ?Exam by:: EJorje GuildNP ? ? ?FHT ?Baseline 165,  moderate variability, no accels, late decels ?Toco: Q 2-4 minutes ?Cat: 2 ? ?Labs ?Results for orders placed or performed during the hospital encounter of 05/28/21 (from the past 24 hour(s))  ?Fern Test     Status: Normal  ? Collection Time: 05/28/21  5:25 PM  ?Result Value Ref Range  ? POCT Fern Test Negative = intact amniotic membranes   ? ? ?Imaging ?No results found. ? ?MAU Course  ?Procedures ?Lab Orders    ?     Fern Test    ?Meds ordered this encounter  ?Medications  ? lactated ringers bolus 1,000 mL  ? ?Imaging Orders  ?No imaging studies ordered today  ? ? ?MDM ?Sterile speculum exam performed - no pooling of fluid & fern negative. Cervix unchanged from office visit yesterday.  ? ?Reviewed [facility] prenatal records under media. GBS negative.  ? ?Category 2 fetal tracing. Tachycardic with moderate variability, no accels, late decels x 2 when first put on monitor. Patient is afebrile. Fluid bolus started.  ?Dr. Landry Mellow notified of tracing & will admit patient. Report given to [facility] who will place admission  orders.  ?Assessment and Plan  ? ?1. Non-reactive NST (non-stress test)   ?2. Encounter for suspected PROM, with rupture of membranes not found   ?3. [redacted] weeks gestation of pregnancy   ? ?-Admit to birthing suites ? ?Jorje Guild, NP ?05/28/21 ?5:54 PM ? ? ?

## 2021-05-28 NOTE — MAU Note (Addendum)
Jeanette Kennedy is a 41 y.o. at 82w0dhere in MAU reporting: around 1515 was using the bathroom and felt a pop. Has continued to leak. Having contractions, has membrane swept yesterday. No bleeding. +FM  ? ?Onset of complaint: today ? ?Pain score: 6/10 ? ?Vitals:  ? 05/28/21 1654  ?BP: 124/84  ?Pulse: 92  ?Resp: 16  ?Temp: 97.9 ?F (36.6 ?C)  ?SpO2: 97%  ?   ?FHT:169 ? ?Lab orders placed from triage: fern ? ?

## 2021-05-28 NOTE — H&P (Signed)
Jeanette Kennedy is a 41 y.o. female, G3P1011, IUP at 42 weeks, presenting for Elective IOL. MAU report NR NST but I do not agree, NST is reactive and Cat 1. Dr Landry Mellow recommended pt stay and be induced for fetal tachycardia and x1 late decel.  H/O AMA, vacuumed assist in previous delivery with third degree. EFW on 2/23 6.8lbs 89%, posterior placenta, AFI 12.3. Pt endorse + Fm. Denies vaginal leakage. Denies vaginal bleeding. ? ?Patient Active Problem List  ? Diagnosis Date Noted  ? Encounter for induction of labor 05/28/2021  ?  ? ?Active Ambulatory Problems  ?  Diagnosis Date Noted  ? No Active Ambulatory Problems  ? ?Resolved Ambulatory Problems  ?  Diagnosis Date Noted  ? Normal labor and delivery 05/16/2019  ? SVD (spontaneous vaginal delivery) 05/17/2019  ? ?Past Medical History:  ?Diagnosis Date  ? Anemia   ?  ? ? ?Medications Prior to Admission  ?Medication Sig Dispense Refill Last Dose  ? ferrous sulfate 325 (65 FE) MG tablet Take 325 mg by mouth daily.     ? Prenatal Vit-Fe Fumarate-FA (PRENATAL MULTIVITAMIN) TABS tablet Take 1 tablet by mouth daily at 12 noon.     ? ? ?Past Medical History:  ?Diagnosis Date  ? Anemia   ?  ? ?No current facility-administered medications on file prior to encounter.  ? ?Current Outpatient Medications on File Prior to Encounter  ?Medication Sig Dispense Refill  ? ferrous sulfate 325 (65 FE) MG tablet Take 325 mg by mouth daily.    ? Prenatal Vit-Fe Fumarate-FA (PRENATAL MULTIVITAMIN) TABS tablet Take 1 tablet by mouth daily at 12 noon.    ?  ? ?No Known Allergies ? ?History of present pregnancy: ?Pt Info/Preference:  Screening/Consents:  Labs:   ?EDD: Estimated Date of Delivery: 06/04/21  ?Establised: Patient's last menstrual period was 09/17/2020.  Anatomy Scan: Date: December 2022 ?Placenta Location: posterior and complete.  Genetic Screen: [place]: LR female ?AFP:  ?First Tri: ?Quad:  ?Office: [facility]            First PNV: 13.6 weeks Blood Type    ?Language: english Last PNV: 38.6  weeks Rhogam    ?Flu Vaccine:  UTD   Antibody    ?TDaP vaccine UTD   GTT: Early: 5.2 ?Third Trimester: 74  ?Feeding Plan: breast BTL: no Rubella:    ?Contraception: ??? VBAC: no RPR:     ?Circumcision: N/A    HBsAg:    ?Pediatrician:  ???   HIV:     ?Prenatal Classes: no Additional [place]: EFW on 2/23 6.8lbs 89%, posterior placenta, AFI 12.3 GBS:  (For PCN allergy, check sensitivities)   ?    Chlamydia: neg  ?  MFM Referral/Consult:  GC: neg  ?Support Person: partner   PAP: ???  ?Pain Management: epidural Neonatologist Referral:  Hgb Electrophoresis:  AA  ?Birth Plan: [facility]   Hgb NOB: 11.6    28W: 10.5  ? ?OB History   ? ? Gravida  ?3  ? Para  ?1  ? Term  ?1  ? Preterm  ?   ? AB  ?1  ? Living  ?1  ?  ? ? SAB  ?1  ? IAB  ?   ? Ectopic  ?   ? Multiple  ?0  ? Live Births  ?1  ?   ?  ?  ? ?Past Medical History:  ?Diagnosis Date  ? Anemia   ? ?Past Surgical History:  ?Procedure Laterality Date  ?  OTHER SURGICAL HISTORY    ? had a vaginal surgery when she was 53, maybe a laceration  ? ?Family History: family history includes Cancer in her mother; Healthy in her father. ?Social History:  reports that she has never smoked. She has never used smokeless tobacco. She reports that she does not currently use alcohol. She reports that she does not use drugs. ? ? ?Prenatal Transfer Tool  ?Maternal Diabetes: No ?Genetic Screening: Normal ?Maternal Ultrasounds/Referrals: Normal ?Fetal Ultrasounds or other Referrals:  None ?Maternal Substance Abuse:  No ?Significant Maternal Medications:  None ?Significant Maternal Lab Results: Group B Strep negative ? ?ROS:  ?Review of Systems  ?Constitutional: Negative.   ?HENT: Negative.    ?Eyes: Negative.   ?Respiratory: Negative.    ?Cardiovascular: Negative.   ?Gastrointestinal:  Positive for abdominal pain.  ?Genitourinary: Negative.   ?Musculoskeletal: Negative.   ?Skin: Negative.   ?Neurological: Negative.   ?Endo/Heme/Allergies: Negative.   ?Psychiatric/Behavioral: Negative.     ? ?Physical  Exam: ?BP 124/84 (BP Location: Right Arm)   Pulse 92   Temp 97.9 ?F (36.6 ?C) (Oral)   Resp 16   LMP 09/17/2020   SpO2 97%   ?Physical Exam ?Vitals and nursing note reviewed.  ?Constitutional:   ?   Appearance: Normal appearance.  ?HENT:  ?   Head: Normocephalic and atraumatic.  ?   Nose: Nose normal.  ?   Mouth/Throat:  ?   Mouth: Mucous membranes are moist.  ?Eyes:  ?   Pupils: Pupils are equal, round, and reactive to light.  ?Cardiovascular:  ?   Rate and Rhythm: Normal rate.  ?   Pulses: Normal pulses.  ?Pulmonary:  ?   Effort: Pulmonary effort is normal.  ?Abdominal:  ?   General: Bowel sounds are normal.  ?Genitourinary: ?   Comments: Uterus gravida , pelvis adequate.  ?Musculoskeletal:     ?   General: Normal range of motion.  ?   Cervical back: Normal range of motion and neck supple.  ?Skin: ?   General: Skin is warm.  ?   Capillary Refill: Capillary refill takes less than 2 seconds.  ?Neurological:  ?   General: No focal deficit present.  ?   Mental Status: She is alert.  ?Psychiatric:     ?   Mood and Affect: Mood normal.  ?  ? ?NST: FHR baseline 150 bpm, Variability: moderate, Accelerations:present, Decelerations:  Absent= Cat 1/Reactive ?UC:   irregular, every 2-6 minutes ?SVE:   Dilation: 4 ?Effacement (%): 50 ?Station: -3 ?Exam by:: Jorje Guild NP, vertex verified by fetal sutures.  ?Leopold's: Position vertex, EFW 7lbs via leopold's.  ? ?Labs: ?Results for orders placed or performed during the hospital encounter of 05/28/21 (from the past 24 hour(s))  ?Fern Test     Status: Normal  ? Collection Time: 05/28/21  5:25 PM  ?Result Value Ref Range  ? POCT Fern Test Negative = intact amniotic membranes   ? ? ?Imaging:  ?No results found. ? ?MAU Course: ?Orders Placed This Encounter  ?Procedures  ? CBC  ? RPR  ? Diet clear liquid Room service appropriate? Yes; Fluid consistency: Thin  ? Vitals signs per unit policy  ? Notify physician (specify)  ? Fetal monitoring per unit policy  ? Activity as  tolerated  ? Cervical Exam  ? Measure blood pressure post delivery every 15 min x 1 hour then every 30 min x 1 hour  ? Fundal check post delivery every 15 min x 1  hour then every 30 min x 1 hour  ? If Rapid HIV test positive or known HIV positive: initiate AZT orders  ? May in and out cath x 2 for inability to void  ? Insert urethral catheter X 1 PRN If Coude Catheter is chosen, qualified resources by campus can be found in the clinical skills nursing procedure for Coude Catheter 1. If straight catheterized > 2 times or patient unable to void post epidural plac...  ? Refer to Sidebar Report Urinary (Foley) Catheter Indications  ? Refer to Sidebar Report Post Indwelling Urinary Catheter Removal and Intervention Guidelines  ? Discontinue foley prior to vaginal delivery  ? Initiate Carrier Fluid Protocol  ? Initiate Oral Care Protocol  ? Patient may have epidural placement upon request  ? Full code  ? Nitrous Oxide 50%/Oxygen 50%  ? Fern Test  ? Type and screen [place]  ? Insert and maintain IV Line  ? Admit to Inpatient (patient's expected length of stay will be greater than 2 midnights or inpatient only procedure)  ? ?Meds ordered this encounter  ?Medications  ? lactated ringers bolus 1,000 mL  ? lactated ringers infusion  ? oxytocin (PITOCIN) IV BOLUS FROM BAG  ? oxytocin (PITOCIN) IV infusion 30 units in NS 500 mL - Premix  ? lactated ringers infusion 500-1,000 mL  ? acetaminophen (TYLENOL) tablet 650 mg  ? oxyCODONE-acetaminophen (PERCOCET/ROXICET) 5-325 MG per tablet 1 tablet  ? oxyCODONE-acetaminophen (PERCOCET/ROXICET) 5-325 MG per tablet 2 tablet  ? ondansetron (ZOFRAN) injection 4 mg  ? sodium citrate-citric acid (ORACIT) solution 30 mL  ? lidocaine (PF) (XYLOCAINE) 1 % injection 30 mL  ? fentaNYL (SUBLIMAZE) injection 50-100 mcg  ? ? ?Assessment/Plan: ?Jeanette Kennedy is a 41 y.o. female, G3P1011, IUP at 58 weeks, presenting for Elective IOL. MAU report NR NST but I do not agree, NST is  reactive and Cat 1. Dr Landry Mellow recommended pt stay and be induced for fetal tachycardia and x1 late decel.  H/O AMA, vacuumed assist in previous delivery with third degree. EFW on 2/23 6.8lbs 89%, posterior placenta, AF

## 2021-05-28 NOTE — Progress Notes (Addendum)
Labor Progress Note ? ?DELANIA FERG is a 41 y.o. female, G3P1011, IUP at 27 weeks, presenting for Elective IOL. H/O AMA, vacuumed assist in previous delivery with third degree. EFW on 2/23 6.8lbs 89%, posterior placenta, AFI 12.3. Pt endorse + Fm. Denies vaginal leakage. Denies vaginal bleeding. ? ?Subjective: ?Pt stable resting well breathing through cxt well. Discussed POC with pt, if no cervical changed noted at midnight, plan to start pitocin per pt understands.  ?Patient Active Problem List  ? Diagnosis Date Noted  ? Encounter for induction of labor 05/28/2021  ? ?Objective: ?BP 114/71   Pulse 86   Temp 98.2 ?F (36.8 ?C) (Oral)   Resp 18   Ht '5\' 3"'$  (1.6 m)   Wt 61.2 kg   LMP 09/17/2020   SpO2 97%   BMI 23.91 kg/m?  ?No intake/output data recorded. ?No intake/output data recorded. ?NST: FHR baseline 150 bpm, Variability: moderate, Accelerations:present, Decelerations:  Absent= Cat 1/Reactive ?CTX:  regular, every 2-3 minutes, lasting 60/80 seconds ?Uterus gravid, soft non tender, moderate to palpate with contractions.  ?SVE:  Dilation: 4 ?Effacement (%): 50 ?Station: -3 ?Exam by:: Jorje Guild NP ?Pitocin at 0 mUn/min ? ?Assessment:  ?ALEAH AHLGRIM is a 41 y.o. female, G3P1011, IUP at 21 weeks, presenting for Elective IOL. H/O AMA, vacuumed assist in previous delivery with third degree. EFW on 2/23 6.8lbs 89%, posterior placenta, AFI 12.3. Pt endorse + Fm. Denies vaginal leakage. Denies vaginal bleeding. Pt breathing through cxt natural.  ?Patient Active Problem List  ? Diagnosis Date Noted  ? Encounter for induction of labor 05/28/2021  ? ?NICHD: Category 1 ? ?Membranes: Intact, no s/s of infection ? ?Induction:   ? Cytotec x N/A pt 4cm dilated.  ? Foley Bulb: N/A pt 4cm dilated upon admission ? Pitocin - 0 ? ?Pain management:   ?            IV pain management: x Fentanyl PRN ? Nitrous: PRN ?            Epidural placement:  PRN ? ?GBS negative ? ?Plan: ?Continue labor plan ?Continuous  monitoring ?Rest ?Ambulate ?Frequent position changes to facilitate fetal rotation and descent. ?RN will reassess with cervical exam at 4 hours or earlier if necessary ?Expectant management.  ?Plan for pitocin per protocol at [facility] 1x1 if no cervical change ?Anticipate labor progression and vaginal delivery.  ? ?Noralyn Pick, NP-C, CNM, MSN ?05/28/2021. 6:59 PM  ?

## 2021-05-29 ENCOUNTER — Encounter (HOSPITAL_COMMUNITY): Payer: Self-pay | Admitting: Obstetrics and Gynecology

## 2021-05-29 ENCOUNTER — Inpatient Hospital Stay (HOSPITAL_COMMUNITY): Payer: No Typology Code available for payment source | Admitting: Anesthesiology

## 2021-05-29 DIAGNOSIS — D62 Acute posthemorrhagic anemia: Secondary | ICD-10-CM | POA: Diagnosis not present

## 2021-05-29 LAB — RPR: RPR Ser Ql: NONREACTIVE

## 2021-05-29 MED ORDER — ACETAMINOPHEN 325 MG PO TABS: 650.0000 mg | ORAL_TABLET | ORAL | Status: DC | PRN

## 2021-05-29 MED ORDER — BENZOCAINE-MENTHOL 20-0.5 % EX AERO: 1.0000 "application " | INHALATION_SPRAY | CUTANEOUS | Status: DC | PRN

## 2021-05-29 MED ORDER — TETANUS-DIPHTH-ACELL PERTUSSIS 5-2.5-18.5 LF-MCG/0.5 IM SUSY: 0.5000 mL | PREFILLED_SYRINGE | Freq: Once | INTRAMUSCULAR | Status: DC

## 2021-05-29 MED ORDER — LIDOCAINE HCL (PF) 1 % IJ SOLN
INTRAMUSCULAR | Status: DC | PRN
  Administered 2021-05-29: 6 mL via EPIDURAL

## 2021-05-29 MED ORDER — COCONUT OIL OIL: 1.0000 "application " | TOPICAL_OIL | Status: DC | PRN

## 2021-05-29 MED ORDER — WITCH HAZEL-GLYCERIN EX PADS: 1.0000 "application " | MEDICATED_PAD | CUTANEOUS | Status: DC | PRN

## 2021-05-29 MED ORDER — DIBUCAINE (PERIANAL) 1 % EX OINT
1.0000 | TOPICAL_OINTMENT | CUTANEOUS | Status: DC | PRN
Start: 2021-05-29 — End: 2021-05-30

## 2021-05-29 MED ORDER — PRENATAL MULTIVITAMIN CH
1.0000 | ORAL_TABLET | Freq: Every day | ORAL | Status: DC
  Administered 2021-05-29 – 2021-05-30 (×2): 1 via ORAL
  Filled 2021-05-29 (×2): qty 1

## 2021-05-29 MED ORDER — FERROUS SULFATE 325 (65 FE) MG PO TABS
325.0000 mg | ORAL_TABLET | Freq: Every day | ORAL | Status: DC
  Administered 2021-05-29 – 2021-05-30 (×2): 325 mg via ORAL
  Filled 2021-05-29 (×2): qty 1

## 2021-05-29 MED ORDER — ONDANSETRON HCL 4 MG/2ML IJ SOLN: 4.0000 mg | INTRAMUSCULAR | Status: DC | PRN

## 2021-05-29 MED ORDER — SENNOSIDES-DOCUSATE SODIUM 8.6-50 MG PO TABS
2.0000 | ORAL_TABLET | Freq: Every day | ORAL | Status: DC
  Administered 2021-05-30: 2 via ORAL
  Filled 2021-05-29: qty 2

## 2021-05-29 MED ORDER — IBUPROFEN 600 MG PO TABS
600.0000 mg | ORAL_TABLET | Freq: Four times a day (QID) | ORAL | Status: DC
  Administered 2021-05-29 – 2021-05-30 (×5): 600 mg via ORAL
  Filled 2021-05-29 (×5): qty 1

## 2021-05-29 MED ORDER — SIMETHICONE 80 MG PO CHEW: 80.0000 mg | CHEWABLE_TABLET | ORAL | Status: DC | PRN

## 2021-05-29 MED ORDER — DIPHENHYDRAMINE HCL 25 MG PO CAPS: 25.0000 mg | ORAL_CAPSULE | Freq: Four times a day (QID) | ORAL | Status: DC | PRN

## 2021-05-29 MED ORDER — ZOLPIDEM TARTRATE 5 MG PO TABS: 5.0000 mg | ORAL_TABLET | Freq: Every evening | ORAL | Status: DC | PRN

## 2021-05-29 MED ORDER — ONDANSETRON HCL 4 MG PO TABS: 4.0000 mg | ORAL_TABLET | ORAL | Status: DC | PRN

## 2021-05-29 NOTE — Progress Notes (Signed)
Labor Progress Note ? ?Jeanette Kennedy is a 41 y.o. female, G3P1011, IUP at 36 weeks, presenting for Elective IOL. H/O AMA, vacuumed assist in previous delivery with third degree. EFW on 2/23 6.8lbs 89%, posterior placenta, AFI 12.3. Pt endorse + Fm. Denies vaginal leakage. Denies vaginal bleeding. ? ?Subjective: ?Pt stable resting well comfortable post epidural placement. Reviewed R/B/A of AROM , pt verbalized consent. Cat 2 strip with variables noted, pt also had a few late decel post epidural and required Phenylphrine for being hypotensive.  ?Patient Active Problem List  ? Diagnosis Date Noted  ? Encounter for induction of labor 05/28/2021  ? ?Objective: ?BP 108/69   Pulse 78   Temp 98.1 ?F (36.7 ?C) (Oral)   Resp 18   Ht '5\' 3"'$  (1.6 m)   Wt 61.2 kg   LMP 09/17/2020   SpO2 98%   BMI 23.91 kg/m?  ?No intake/output data recorded. ?No intake/output data recorded. ?NST: FHR baseline 145 bpm, Variability: moderate for the most part but episodes of minimal variability noted, Accelerations:present, Decelerations:  Present varibles and occ late noted= Cat 2/Reactive ?CTX:  regular, every 2-3 minutes, lasting 60/80 seconds ?Uterus gravid, soft non tender, moderate to palpate with contractions.  ?SVE:  Dilation: 8 ?Effacement (%): 80 ?Station: +1 ?Exam by:: [facility] ?Pitocin at 3 mUn/min ? ?Assessment:  ?Jeanette Kennedy is a 41 y.o. female, G3P1011, IUP at 12 weeks, presenting for Elective IOL. H/O AMA, vacuumed assist in previous delivery with third degree. EFW on 2/23 6.8lbs 89%, posterior placenta, AFI 12.3. Pt endorse + Fm. Denies vaginal leakage. Denies vaginal bleeding. Pt comfortable wit epidural, progressing on pitocin at 8cm.  ?Patient Active Problem List  ? Diagnosis Date Noted  ? Encounter for induction of labor 05/28/2021  ? ?NICHD: Category 2 ? AROM, fluid bolus and position changed performed.  ? ?Membranes: AROM 2831 3/25, clear at , no s/s of infection ? ?Induction:   ? Cytotec x N/A pt 5cm dilated.   ? Foley Bulb: N/A pt 4cm dilated upon admission ? Pitocin - 3 ? ?Pain management:   ?            IV pain management: x Fentanyl PRN ? Nitrous: PRN ?            Epidural placement:  Placed at [facility] on 3/25 ? ?GBS negative ? ?Plan: ?Continue labor plan ?Continuous monitoring ?Rest ?Frequent position changes to facilitate fetal rotation and descent. ?RN will reassess with cervical exam at 4 hours or earlier if necessary ?Expectant management.  ?Continue pitocin per protocol 1x1 ?Anticipate labor progression and vaginal delivery.  ? ?Jeanette Pick, NP-C, CNM, MSN ?05/29/2021. 5:13 AM  ?

## 2021-05-29 NOTE — Progress Notes (Signed)
Labor Progress Note ? ?Jeanette Kennedy is a 41 y.o. female, G3P1011, IUP at 42 weeks, presenting for Elective IOL. H/O AMA, vacuumed assist in previous delivery with third degree. EFW on 2/23 6.8lbs 89%, posterior placenta, AFI 12.3. Pt endorse + Fm. Denies vaginal leakage. Denies vaginal bleeding. ? ?Subjective: ?Pt stable resting well comfortable post epidural placement.  ?Patient Active Problem List  ? Diagnosis Date Noted  ? Encounter for induction of labor 05/28/2021  ? ?Objective: ?BP 108/69   Pulse 78   Temp 98.1 ?F (36.7 ?C) (Oral)   Resp 18   Ht '5\' 3"'$  (1.6 m)   Wt 61.2 kg   LMP 09/17/2020   SpO2 98%   BMI 23.91 kg/m?  ?No intake/output data recorded. ?No intake/output data recorded. ?NST: FHR baseline 150 bpm, Variability: moderate, Accelerations:present, Decelerations:  Absent= Cat 1/Reactive ?CTX:  regular, every 2-3 minutes, lasting 60/80 seconds ?Uterus gravid, soft non tender, moderate to palpate with contractions.  ?SVE:  Dilation: 5 ?Effacement (%): 80 ?Station: -2 ?Exam by:: [facility] ?Pitocin at 2 mUn/min ? ?Assessment:  ?Jeanette Kennedy is a 41 y.o. female, G3P1011, IUP at 42 weeks, presenting for Elective IOL. H/O AMA, vacuumed assist in previous delivery with third degree. EFW on 2/23 6.8lbs 89%, posterior placenta, AFI 12.3. Pt endorse + Fm. Denies vaginal leakage. Denies vaginal bleeding. Pt comfortable wit epidural, progressing on pitocin at 5cm.  ?Patient Active Problem List  ? Diagnosis Date Noted  ? Encounter for induction of labor 05/28/2021  ? ?NICHD: Category 1 ? ?Membranes: Intact, no s/s of infection ? ?Induction:   ? Cytotec x N/A pt 5cm dilated.  ? Foley Bulb: N/A pt 4cm dilated upon admission ? Pitocin - 2 ? ?Pain management:   ?            IV pain management: x Fentanyl PRN ? Nitrous: PRN ?            Epidural placement:  Placed at [facility] on 3/25 ? ?GBS negative ? ?Plan: ?Continue labor plan ?Continuous monitoring ?Rest ?Frequent position changes to facilitate fetal rotation  and descent. ?RN will reassess with cervical exam at 4 hours or earlier if necessary ?Expectant management.  ?Continue pitocin per protocol 1x1 ?Anticipate labor progression and vaginal delivery.  ? ?Noralyn Pick, NP-C, CNM, MSN ?05/29/2021. 5:09 AM  ?

## 2021-05-29 NOTE — Lactation Note (Signed)
This note was copied from a baby's chart. ?Lactation Consultation Note ? ?Patient Name: Jeanette Kennedy ?Today's Date: 05/29/2021 ?Reason for consult: L&D Initial assessment ?Age:39 hours ? ? ?Baby on mom's chest sleeping when LC entered the room. Baby "Jasmine", recently BF. Mom and dad feel things are going well.  Doula supported mom with BF and assisted with latch prior to [facility] entering.   ? ?Mom was encouraged to keep baby skin to skin and feed with cues.  ? ?Maternal Data ?Has patient been taught Hand Expression?: No ?Does the patient have breastfeeding experience prior to this delivery?: Yes ?How long did the patient breastfeed?: 2 years with previous child ? ?Feeding ?Mother's Current Feeding Choice: Breast Milk ? ?LATCH Score ?  ? ?  ? ?  ? ?  ? ?  ? ?  ? ? ?Lactation Tools Discussed/Used ?  ? ?Interventions ?  ? ?Discharge ?  ? ?Consult Status ?Consult Status: Follow-up ?Date: 05/29/21 ?Follow-up type: In-patient ? ? ? ?Ferne Coe Daniell Paradise ?05/29/2021, 8:02 AM ? ? ? ?

## 2021-05-29 NOTE — Discharge Summary (Signed)
?  VAVD OB Discharge Summary ? ?   ?Patient Name: Jeanette Kennedy ?DOB: Jan 01, 1981 ?MRN: [id number] ? ?Date of admission: 05/28/2021 ?Delivering MD: Christophe Louis  ?Date of delivery: 05/29/2021 ?Type of delivery: VAVD ? ?Newborn Data: ?Sex: Baby female ?Live born female  ?Birth Weight:   ?APGAR: 9, 9 ? ?Newborn Delivery   ?Birth date/time: 05/29/2021 06:50:00 ?Delivery type: Vaginal, Vacuum (Extractor) ?  ?  ? ?Feeding: breast ?Infant being discharge to home with mother in stable condition.  ? ?Admitting diagnosis: Encounter for induction of labor [Z34.90] ?Intrauterine pregnancy: [redacted]w[redacted]d    ?Secondary diagnosis:  Principal Problem: ?  Encounter for induction of labor ?Active Problems: ?  SVD (spontaneous vaginal delivery) ?  Normal postpartum course ?  Acute blood loss anemia ?  Vacuum extractor delivery, delivered ?                              ? ?Complications: None                                                              ?Intrapartum Procedures: vacuum and episiotomy medio-lat with 2nd degree ?Postpartum Procedures: none ?Complications-Operative and Postpartum:  2nd degree perineal laceration ?Augmentation: AROM and Pitocin ? ? ?History of Present Illness: ?Ms. Jeanette DUFFis a 41y.o. female, G3P1011, who presents at 368w1deeks gestation. [facility]  ?Her pregnancy has been complicated by: ? ?Patient Active Problem List  ? Diagnosis Date Noted  ? SVD (spontaneous vaginal delivery) 05/29/2021  ? Normal postpartum course 05/29/2021  ? Acute blood loss anemia 05/29/2021  ? Vacuum extractor delivery, delivered 05/29/2021  ? Encounter for induction of labor 05/28/2021  ?  ? ?Active Ambulatory Problems  ?  Diagnosis Date Noted  ? No Active Ambulatory Problems  ? ?Resolved Ambulatory Problems  ?  Diagnosis Date Noted  ? Normal labor and delivery 05/16/2019  ? SVD (spontaneous vaginal delivery) 05/17/2019  ? ?Past Medical History:  ?Diagnosis Date  ? Anemia    ?  ? ?Hospital course:  Induction of Labor With Vaginal Delivery   ?[age].o. yo G3P1011 at 3948w1ds admitted to the hospital 05/28/2021 for induction of labor.  Indication for induction: Elective.  Patient had an uncomplicated labor course as follows: ?Membrane Rupture Time/Date: 4:58 AM ,05/29/2021   ?Delivery Method:Vaginal, Vacuum (Extractor)  ?Episiotomy: Right Mediolateral  ?Lacerations:  2nd degree  ?Details of delivery can be found in separate delivery note.  Patient had a routine postpartum course. Patient is discharged home 05/30/21. ? ?Newborn Data: ?Birth date:05/29/2021  ?Birth time:6:50 AM  ?Gender:Female  ?Living status:Living  ?Apgars:9 ,9  ?Weight:3490 g  ?Postpartum Day # 1 : S/P NSVD due to pt was admitted on 05/28/2021 for elective IOL at 39 weeks, progress with pitocin and AROM, had VAVD on 3/25 over 2nd degree with episiotomy, ebl was 30m56mwith hgb drop of 10.9 to 10. On po iron, and stable, asymptomatic. Had baby female. Patient up ad lib, denies syncope or dizziness. Reports consuming regular diet without issues and denies N/V. Patient reports 0 bowel movement + passing flatus.  Denies issues with urination and reports bleeding is "lighter."  Patient  is breastfeeding and reports going well.  Desires vasectomy for postpartum contraception.  Pain is being appropriately managed with use of po meds.  Pt meets early discharge criteria and desires to be discharged.  ? ? ?Physical exam  ?Vitals:  ? 05/29/21 1000 05/29/21 1415 05/29/21 1735 05/29/21 2200  ?BP: 105/75 109/63 119/71 107/65  ?Pulse: 72 72 64 66  ?Resp: '18 18 18 18  '$ ?Temp: 98.2 ?F (36.8 ?C) 98.4 ?F (36.9 ?C) 98.3 ?F (36.8 ?C) 98.1 ?F (36.7 ?C)  ?TempSrc:  Oral Oral Oral  ?SpO2: 100% 100% 100% 99%  ?Weight:      ?Height:      ? ?General: alert, cooperative, and no distress ?Lochia: appropriate ?Uterine Fundus: firm ?Perineum: approximate ?DVT Evaluation: No evidence of DVT seen on physical exam. ?Negative Homan's sign. ?No cords or calf  tenderness. ?No significant calf/ankle edema. ? ?Labs: ?Lab Results  ?Component Value Date  ? WBC 13.9 (H) 05/30/2021  ? HGB 10.0 (L) 05/30/2021  ? HCT 29.8 (L) 05/30/2021  ? MCV 98.0 05/30/2021  ? PLT 209 05/30/2021  ? ?   ? View : No data to display.  ?  ?  ?  ? ? ?Date of discharge: 05/30/2021 ?Discharge Diagnoses: Term Pregnancy-delivered ?Discharge instruction: per After Visit Summary and "Baby and Me Booklet". ? ?After visit meds:  ? ?Activity:           unrestricted and pelvic rest Advance as tolerated. Pelvic rest for 6 weeks.  ?Diet:                routine ?Medications: PNV, Ibuprofen, Colace, and Iron ?Postpartum contraception: Undecided ?Condition:  Pt discharge to home with baby in stable Condition  ? ?Meds: ?Allergies as of 05/30/2021   ?No Known Allergies ?  ? ?  ?Medication List  ?  ? ?TAKE these medications   ? ?ferrous sulfate 325 (65 FE) MG tablet ?Take 325 mg by mouth daily. ?  ?ibuprofen 600 MG tablet ?Commonly known as: ADVIL ?Take 1 tablet (600 mg total) by mouth every 6 (six) hours. ?  ?prenatal multivitamin Tabs tablet ?Take 1 tablet by mouth daily at 12 noon. ?  ? ?  ? ? ?Discharge Follow Up:  ? Follow-up Information   ? ? [facility]. Schedule an appointment as soon as possible for a visit in 6 week(s).   ?Specialty: Obstetrics and Gynecology ?Contact information: ?[address] ?[address] ?[place] [address] ?[phone] ? ?  ?  ? ?  ?  ? ?  ?  ? ?Noralyn Pick, NP-C, CNM ?05/30/2021, 11:02 AM  ?Noralyn Pick, [place] ? ? ? ? ? ? ?

## 2021-05-29 NOTE — Anesthesia Postprocedure Evaluation (Signed)
Anesthesia Post Note ? ?Patient: Jeanette Kennedy ? ?Procedure(s) Performed: AN AD HOC LABOR EPIDURAL ? ?  ? ?Patient location during evaluation: Mother Baby ?Anesthesia Type: Epidural ?Level of consciousness: awake and alert ?Pain management: pain level controlled ?Vital Signs Assessment: post-procedure vital signs reviewed and stable ?Respiratory status: spontaneous breathing, nonlabored ventilation and respiratory function stable ?Cardiovascular status: stable ?Postop Assessment: no headache, no backache and epidural receding ?Anesthetic complications: no ? ? ?No notable events documented. ? ?Last Vitals:  ?Vitals:  ? 05/29/21 1415 05/29/21 1735  ?BP: 109/63 119/71  ?Pulse: 72 64  ?Resp: 18 18  ?Temp: 36.9 ?C 36.8 ?C  ?SpO2: 100% 100%  ?  ?Last Pain:  ?Vitals:  ? 05/29/21 1735  ?TempSrc: Oral  ?PainSc: 0-No pain  ? ?Pain Goal:   ? ?  ?  ?  ?  ?  ?  ?  ? ?Mechel Haggard ? ? ? ? ?

## 2021-05-29 NOTE — Anesthesia Procedure Notes (Signed)
Epidural ?Patient location during procedure: OB ?Start time: 05/29/2021 12:03 AM ?End time: 05/29/2021 12:15 AM ? ?Staffing ?Anesthesiologist: Merlinda Frederick, MD ?Performed: anesthesiologist  ? ?Preanesthetic Checklist ?Completed: patient identified, IV checked, site marked, risks and benefits discussed, monitors and equipment checked, pre-op evaluation and timeout performed ? ?Epidural ?Patient position: sitting ?Prep: DuraPrep ?Patient monitoring: heart rate, cardiac monitor, continuous pulse ox and blood pressure ?Approach: midline ?Location: L2-L3 ?Injection technique: LOR saline ? ?Needle:  ?Needle type: Tuohy  ?Needle gauge: 17 G ?Needle length: 9 cm ?Needle insertion depth: 5 cm ?Catheter type: closed end flexible ?Catheter size: 20 Guage ?Catheter at skin depth: 9 cm ?Test dose: negative and Other ? ?Assessment ?Events: blood not aspirated, injection not painful, no injection resistance and negative IV test ? ?Additional Notes ?Informed consent obtained prior to proceeding including risk of failure, 1% risk of PDPH, risk of minor discomfort and bruising.  Discussed rare but serious complications including epidural abscess, permanent nerve injury, epidural hematoma.  Discussed alternatives to epidural analgesia and patient desires to proceed.  Timeout performed pre-procedure verifying patient name, procedure, and platelet count.  Patient tolerated procedure well. ? ? ? ? ?

## 2021-05-29 NOTE — Anesthesia Preprocedure Evaluation (Signed)
Anesthesia Evaluation  ?Patient identified by MRN, date of birth, ID band ?Patient awake ? ? ? ?Reviewed: ?Allergy & Precautions, NPO status , Patient's Chart, lab work & pertinent test results ? ?Airway ?Mallampati: II ? ?TM Distance: >3 FB ?Neck ROM: Full ? ? ? Dental ?no notable dental hx. ? ?  ?Pulmonary ?neg pulmonary ROS,  ?  ?Pulmonary exam normal ?breath sounds clear to auscultation ? ? ? ? ? ? Cardiovascular ?negative cardio ROS ?Normal cardiovascular exam ?Rhythm:Regular Rate:Normal ? ? ?  ?Neuro/Psych ?negative neurological ROS ? negative psych ROS  ? GI/Hepatic ?negative GI ROS, Neg liver ROS,   ?Endo/Other  ?negative endocrine ROS ? Renal/GU ?negative Renal ROS  ?negative genitourinary ?  ?Musculoskeletal ?negative musculoskeletal ROS ?(+)  ? Abdominal ?  ?Peds ?negative pediatric ROS ?(+)  Hematology ? ?(+) Blood dyscrasia, anemia ,   ?Anesthesia Other Findings ? ? Reproductive/Obstetrics ?(+) Pregnancy ? ?  ? ? ? ? ? ? ? ? ? ? ? ? ? ?  ?  ? ? ? ? ? ? ? ? ?Anesthesia Physical ?Anesthesia Plan ? ?ASA: 2 ? ?Anesthesia Plan: Epidural  ? ?Post-op Pain Management:   ? ?Induction:  ? ?PONV Risk Score and Plan: 2 and Treatment may vary due to age or medical condition ? ?Airway Management Planned: Natural Airway ? ?Additional Equipment:  ? ?Intra-op Plan:  ? ?Post-operative Plan:  ? ?Informed Consent: I have reviewed the patients History and Physical, chart, labs and discussed the procedure including the risks, benefits and alternatives for the proposed anesthesia with the patient or authorized representative who has indicated his/her understanding and acceptance.  ? ? ? ? ? ?Plan Discussed with: Anesthesiologist ? ?Anesthesia Plan Comments:   ? ? ? ? ? ? ?Anesthesia Quick Evaluation ? ?

## 2021-05-29 NOTE — Lactation Note (Signed)
This note was copied from a baby's chart. ?Lactation Consultation Note ? ?Patient Name: Jeanette Kennedy ?Today's Date: 05/29/2021 ?Reason for consult: Initial assessment;Term ?Age:39 hours ? ?LC in to room for initial consult. Mother states infant has been latching well, no pain or discomfort. Mother reports breastfeeding her first child until January 2023. Mother's support person is very experienced with lactation and helps with breastfeeding as needed.  ?Reviewed normal newborn behavior during first 24h, expected output and feeding frequency.  ? ?Plan: ?1-Skin to skin, aim for a deep, comfortable latch and breastfeed on demand or 8-12 times in 24h period. ?2-Encouraged maternal rest, hydration and food intake.  ?3-Contact LC as needed for feeds/support/concerns/questions ?  ?All questions answered at this time. Provided Lactation services brochure and other resources available.  ? ? ?Maternal Data ?Has patient been taught Hand Expression?: No ?Does the patient have breastfeeding experience prior to this delivery?: Yes ?How long did the patient breastfeed?: 24 months ? ?Feeding ?Mother's Current Feeding Choice: Breast Milk ? ?LATCH Score ?Latch: Too sleepy or reluctant, no latch achieved, no sucking elicited. ? ?Audible Swallowing: None ? ?Type of Nipple: Everted at rest and after stimulation ? ?Comfort (Breast/Nipple): Soft / non-tender ? ?Hold (Positioning): Assistance needed to correctly position infant at breast and maintain latch. ? ?LATCH Score: 5 ? ?Interventions ?Interventions: Breast feeding basics reviewed;Skin to skin;Expressed milk;Education;LC Services brochure ? ?Discharge ?Pump: Personal ?[place] Program: Yes ? ?Consult Status ?Consult Status: Follow-up ?Date: 05/30/21 ?Follow-up type: In-patient ? ? ? ?[name] ?05/29/2021, 12:41 PM ? ? ? ?

## 2021-05-30 LAB — CBC
HCT: 29.8 % — ABNORMAL LOW (ref 36.0–46.0)
Hemoglobin: 10 g/dL — ABNORMAL LOW (ref 12.0–15.0)
MCH: 32.9 pg (ref 26.0–34.0)
MCHC: 33.6 g/dL (ref 30.0–36.0)
MCV: 98 fL (ref 80.0–100.0)
Platelets: 209 10*3/uL (ref 150–400)
RBC: 3.04 MIL/uL — ABNORMAL LOW (ref 3.87–5.11)
RDW: 13.7 % (ref 11.5–15.5)
WBC: 13.9 10*3/uL — ABNORMAL HIGH (ref 4.0–10.5)
nRBC: 0 % (ref 0.0–0.2)

## 2021-05-30 MED ORDER — IBUPROFEN 600 MG PO TABS: 600.0000 mg | ORAL_TABLET | Freq: Four times a day (QID) | ORAL | 0 refills | Status: AC

## 2021-06-08 ENCOUNTER — Telehealth (HOSPITAL_COMMUNITY): Payer: Self-pay | Admitting: *Deleted

## 2021-06-08 NOTE — Telephone Encounter (Signed)
Patient voiced no questions or concerns regarding her health at this time. EPDS=1. Patient voiced no questions or concerns regarding infant at this time. Patient reports infant sleeps in a crib on her back. RN reviewed ABCs of safe sleep. Patient verbalized understanding. Patient requested RN email information on hospital's virtual postpartum classes and support groups. Email sent. Erline Levine, RN, 06/08/21, [phone]   ?

## 2021-11-23 IMAGING — US US OB COMP LESS 14 WK
1 series · 15 of 28 positions shown · non-contrast
Comparison: Ultrasound of prior gestation 03/21/2019

CLINICAL DATA: Vaginal bleeding

EXAM:
OBSTETRIC <14 WK ULTRASOUND
TECHNIQUE: Transabdominal ultrasound was performed for evaluation of the
gestation as well as the maternal uterus and adnexal regions.

[Series 1: us ob comp less 14 wk · 15 of 44 slices shown]
[im 1/44]
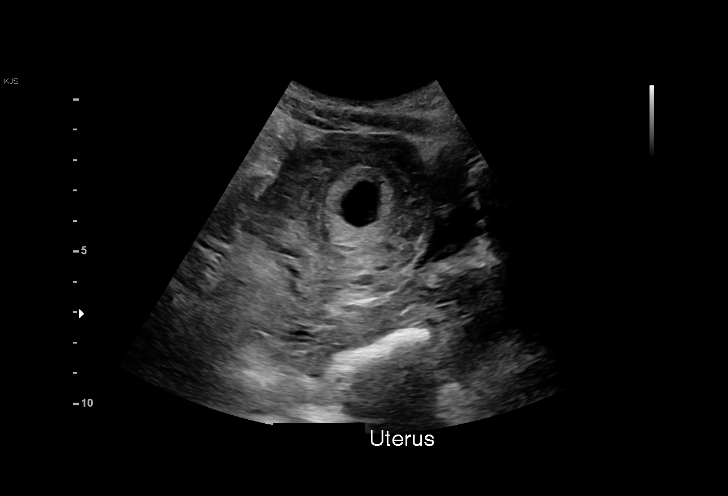
[im 4/44]
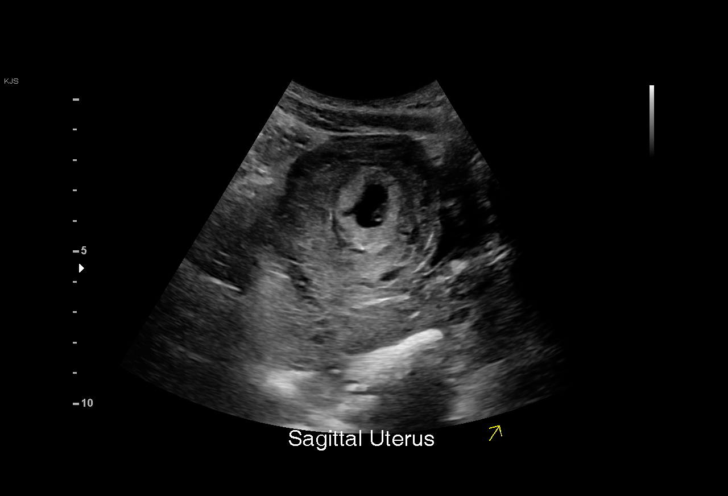
[im 7/44]
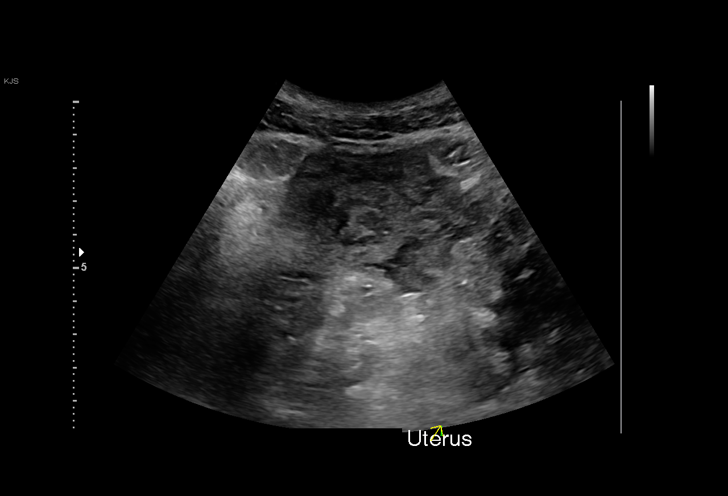
[im 10/44]
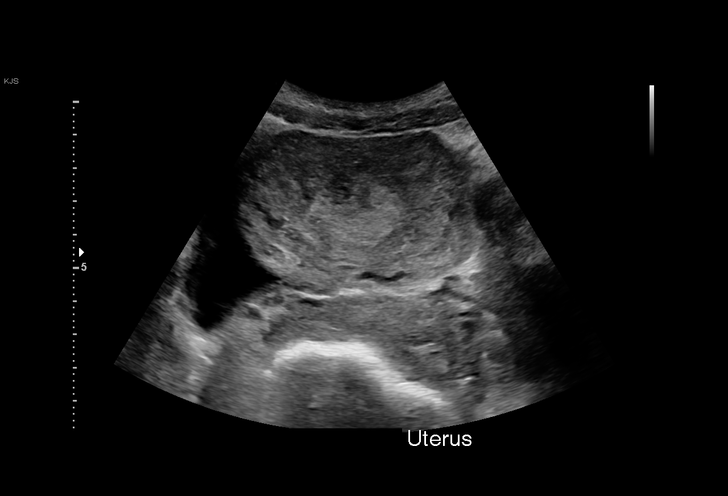
[im 13/44]
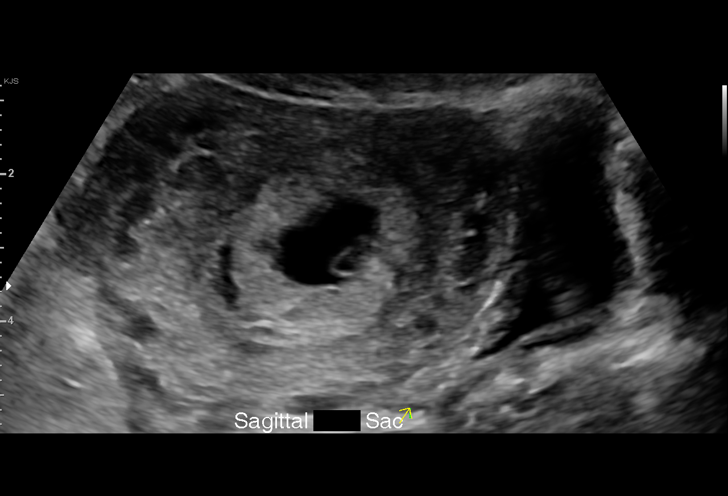
[im 16/44]
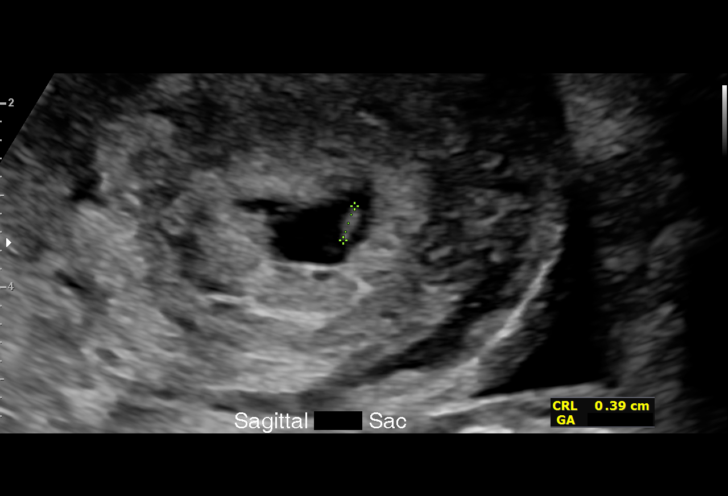
[im 20/44]
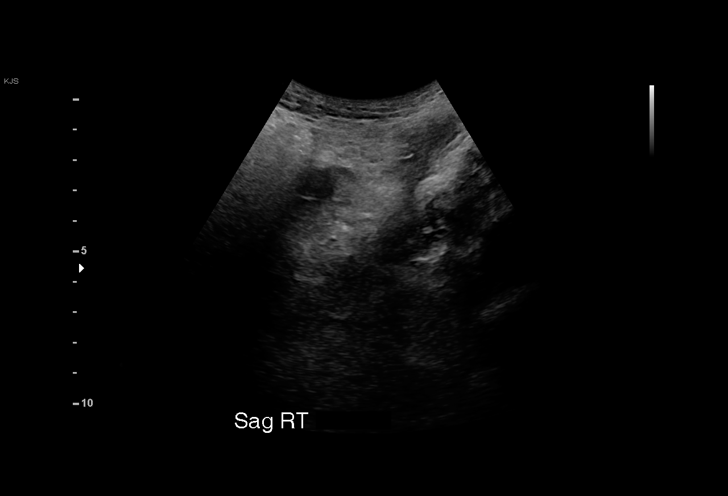
[im 23/44]
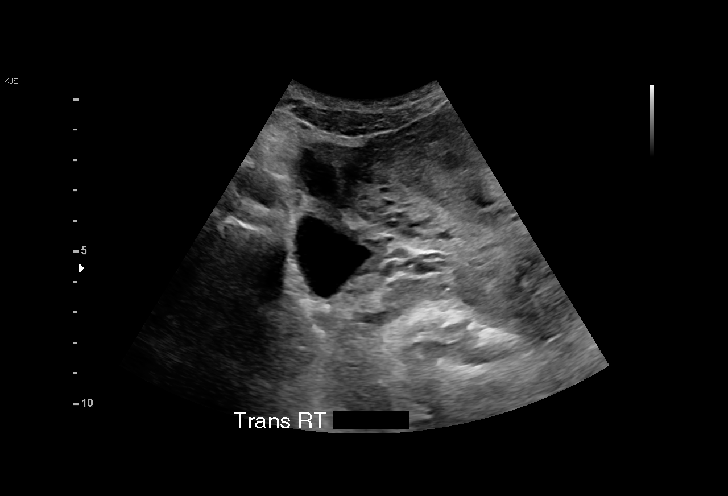
[im 24/44]
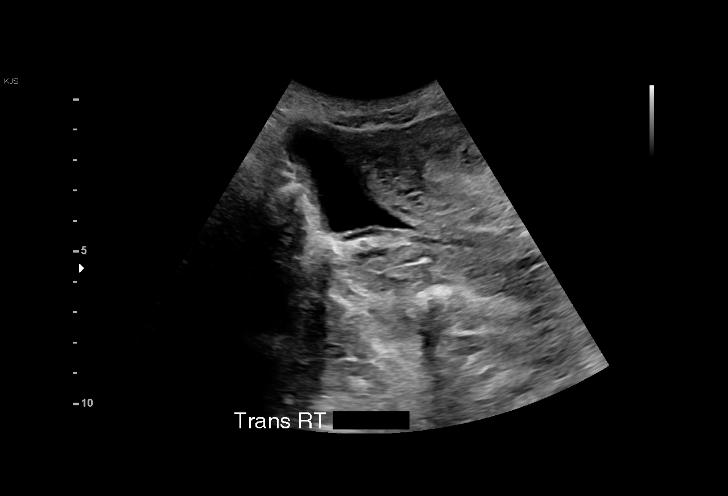
[im 28/44]
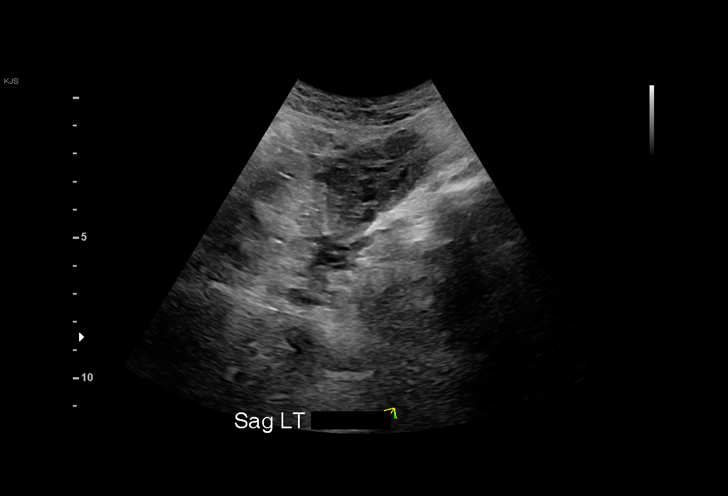
[im 31/44]
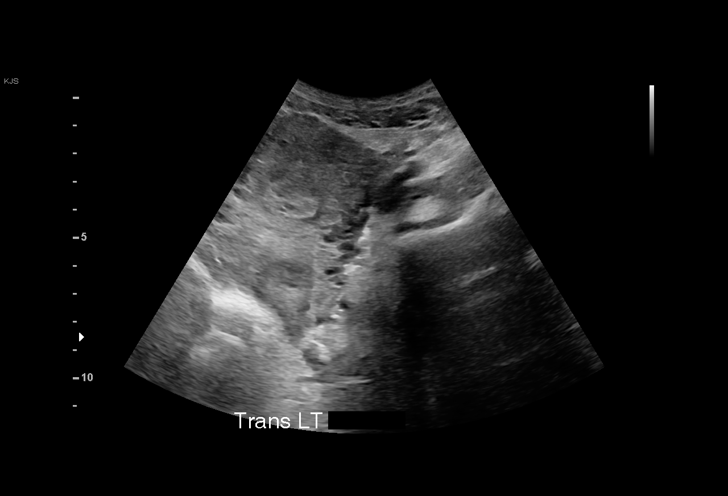
[im 34/44]
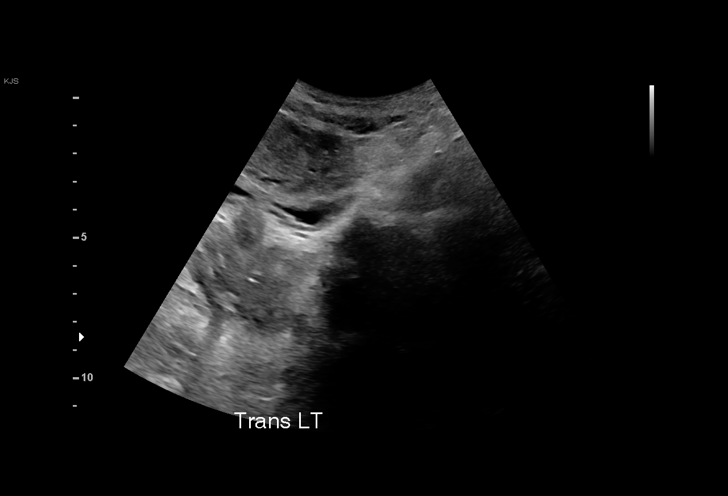
[im 37/44]
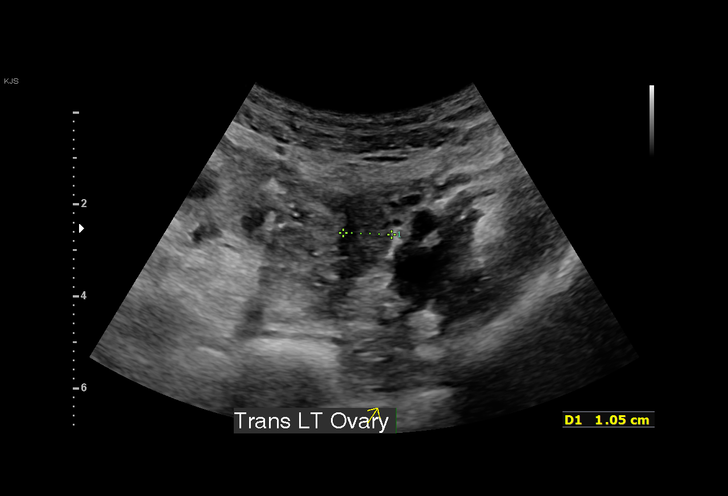
[im 40/44]
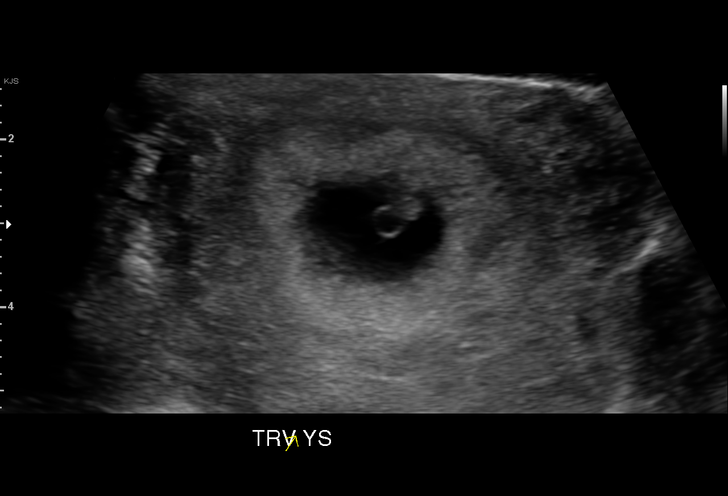
[im 44/44]
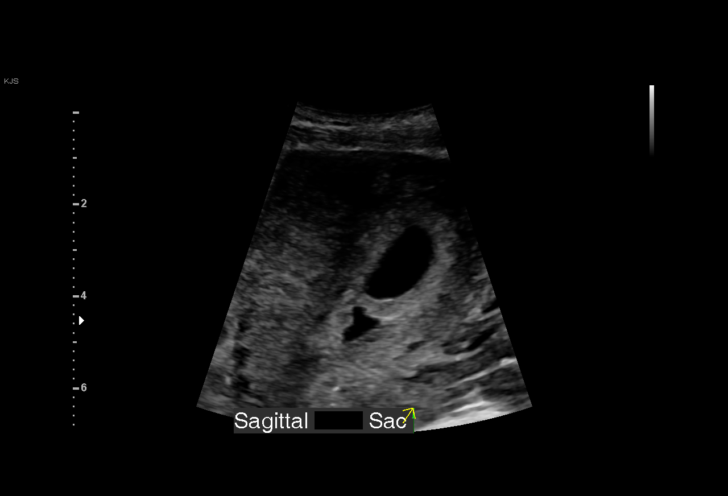

[15 of 28 positions shown; findings below may reference images not displayed]

FINDINGS: Intrauterine gestational sac: Single

Yolk sac:  Visualized.

Embryo:  Visualized.

Cardiac Activity: Visualized.

Heart Rate: 121 bpm

CRL:   4 mm   6 w 0 d                  US EDC: 06/04/2021

Subchorionic hemorrhage: Small volume subchorionic hemorrhage is
noted (10/44).

Maternal uterus/adnexae: Anteverted maternal uterus. No other
concerning focal uterine abnormality. Probable right corpus luteum
cyst is noted. No worrisome adnexal lesions. No pelvic free fluid.
IMPRESSION: Single intrauterine gestation at 6 weeks, 0 days by crown-rump
length sonographic estimation.

Small volume subchorionic hemorrhage.

No other acute sonographic complication.

## 2022-03-29 NOTE — Telephone Encounter (Signed)
No additional notes for this patient.
